# Patient Record
Sex: Female | Born: 1945 | Race: White | Hispanic: No | Marital: Married | Smoking: Former smoker
Health system: Southern US, Community
[De-identification: ages and names within clinical notes are randomized; demographics above are authoritative.]

## PROBLEM LIST (undated history)

## (undated) DIAGNOSIS — R911 Solitary pulmonary nodule: Secondary | ICD-10-CM

## (undated) DIAGNOSIS — R7611 Nonspecific reaction to tuberculin skin test without active tuberculosis: Secondary | ICD-10-CM

## (undated) DIAGNOSIS — Z0000 Encounter for general adult medical examination without abnormal findings: Secondary | ICD-10-CM

## (undated) DIAGNOSIS — M13879 Other specified arthritis, unspecified ankle and foot: Secondary | ICD-10-CM

## (undated) DIAGNOSIS — M797 Fibromyalgia: Secondary | ICD-10-CM

## (undated) DIAGNOSIS — Z780 Asymptomatic menopausal state: Secondary | ICD-10-CM

## (undated) DIAGNOSIS — R9389 Abnormal findings on diagnostic imaging of other specified body structures: Secondary | ICD-10-CM

## (undated) DIAGNOSIS — G5600 Carpal tunnel syndrome, unspecified upper limb: Secondary | ICD-10-CM

## (undated) HISTORY — DX: Solitary pulmonary nodule: R91.1

## (undated) HISTORY — DX: Other specified arthritis, unspecified ankle and foot: M13.879

## (undated) HISTORY — DX: Carpal tunnel syndrome, unspecified upper limb: G56.00

## (undated) HISTORY — DX: Nonspecific reaction to tuberculin skin test without active tuberculosis: R76.11

## (undated) HISTORY — DX: Encounter for general adult medical examination without abnormal findings: Z00.00

## (undated) HISTORY — DX: Fibromyalgia: M79.7

## (undated) HISTORY — PX: CATARACT EXTRACTION: SUR2

## (undated) HISTORY — PX: OTHER SURGICAL HISTORY: SHX169

## (undated) HISTORY — DX: Abnormal findings on diagnostic imaging of other specified body structures: R93.89

## (undated) HISTORY — DX: Asymptomatic menopausal state: Z78.0

---

## 1997-10-20 ENCOUNTER — Ambulatory Visit (HOSPITAL_COMMUNITY): Admission: RE | Admit: 1997-10-20 | Discharge: 1997-10-20 | Payer: Self-pay | Admitting: *Deleted

## 1998-12-13 ENCOUNTER — Ambulatory Visit (HOSPITAL_COMMUNITY): Admission: RE | Admit: 1998-12-13 | Discharge: 1998-12-13 | Payer: Self-pay | Admitting: *Deleted

## 2000-01-02 ENCOUNTER — Encounter: Payer: Self-pay | Admitting: Obstetrics and Gynecology

## 2000-01-02 ENCOUNTER — Ambulatory Visit (HOSPITAL_COMMUNITY): Admission: RE | Admit: 2000-01-02 | Discharge: 2000-01-02 | Payer: Self-pay | Admitting: Obstetrics and Gynecology

## 2001-01-21 ENCOUNTER — Other Ambulatory Visit: Admission: RE | Admit: 2001-01-21 | Discharge: 2001-01-21 | Payer: Self-pay | Admitting: Internal Medicine

## 2001-01-31 ENCOUNTER — Encounter: Payer: Self-pay | Admitting: Obstetrics and Gynecology

## 2001-01-31 ENCOUNTER — Ambulatory Visit (HOSPITAL_COMMUNITY): Admission: RE | Admit: 2001-01-31 | Discharge: 2001-01-31 | Payer: Self-pay | Admitting: Obstetrics and Gynecology

## 2002-02-26 ENCOUNTER — Other Ambulatory Visit: Admission: RE | Admit: 2002-02-26 | Discharge: 2002-02-26 | Payer: Self-pay | Admitting: Internal Medicine

## 2002-04-02 ENCOUNTER — Encounter: Payer: Self-pay | Admitting: Internal Medicine

## 2002-04-02 ENCOUNTER — Ambulatory Visit (HOSPITAL_COMMUNITY): Admission: RE | Admit: 2002-04-02 | Discharge: 2002-04-02 | Payer: Self-pay | Admitting: Internal Medicine

## 2002-04-09 ENCOUNTER — Encounter: Payer: Self-pay | Admitting: Internal Medicine

## 2002-04-09 ENCOUNTER — Ambulatory Visit (HOSPITAL_COMMUNITY): Admission: RE | Admit: 2002-04-09 | Discharge: 2002-04-09 | Payer: Self-pay | Admitting: Internal Medicine

## 2003-09-15 ENCOUNTER — Ambulatory Visit (HOSPITAL_COMMUNITY): Admission: RE | Admit: 2003-09-15 | Discharge: 2003-09-15 | Payer: Self-pay | Admitting: Internal Medicine

## 2003-11-17 ENCOUNTER — Other Ambulatory Visit: Admission: RE | Admit: 2003-11-17 | Discharge: 2003-11-17 | Payer: Self-pay | Admitting: Internal Medicine

## 2003-12-02 ENCOUNTER — Ambulatory Visit (HOSPITAL_COMMUNITY): Admission: RE | Admit: 2003-12-02 | Discharge: 2003-12-02 | Payer: Self-pay | Admitting: Internal Medicine

## 2003-12-02 ENCOUNTER — Encounter: Payer: Self-pay | Admitting: Family Medicine

## 2006-08-06 ENCOUNTER — Ambulatory Visit: Payer: Self-pay | Admitting: Family Medicine

## 2006-08-21 ENCOUNTER — Ambulatory Visit: Payer: Self-pay

## 2006-08-21 ENCOUNTER — Encounter: Payer: Self-pay | Admitting: Cardiology

## 2006-08-22 ENCOUNTER — Ambulatory Visit: Payer: Self-pay | Admitting: Family Medicine

## 2006-08-22 LAB — CONVERTED CEMR LAB
GFR calc Af Amer: 82 mL/min
Glucose, Bld: 92 mg/dL (ref 70–99)
Sodium: 143 meq/L (ref 135–145)

## 2006-11-18 DIAGNOSIS — IMO0001 Reserved for inherently not codable concepts without codable children: Secondary | ICD-10-CM | POA: Insufficient documentation

## 2006-11-18 DIAGNOSIS — G5600 Carpal tunnel syndrome, unspecified upper limb: Secondary | ICD-10-CM | POA: Insufficient documentation

## 2006-11-26 ENCOUNTER — Ambulatory Visit: Payer: Self-pay | Admitting: Family Medicine

## 2006-11-26 DIAGNOSIS — I10 Essential (primary) hypertension: Secondary | ICD-10-CM | POA: Insufficient documentation

## 2006-11-28 ENCOUNTER — Encounter: Payer: Self-pay | Admitting: Family Medicine

## 2007-02-13 ENCOUNTER — Encounter: Payer: Self-pay | Admitting: Family Medicine

## 2007-02-13 ENCOUNTER — Other Ambulatory Visit: Admission: RE | Admit: 2007-02-13 | Discharge: 2007-02-13 | Payer: Self-pay | Admitting: Family Medicine

## 2007-02-13 ENCOUNTER — Ambulatory Visit: Payer: Self-pay | Admitting: Family Medicine

## 2007-02-13 DIAGNOSIS — N951 Menopausal and female climacteric states: Secondary | ICD-10-CM | POA: Insufficient documentation

## 2007-02-13 DIAGNOSIS — M13879 Other specified arthritis, unspecified ankle and foot: Secondary | ICD-10-CM | POA: Insufficient documentation

## 2007-02-13 LAB — CONVERTED CEMR LAB
Blood in Urine, dipstick: NEGATIVE
Ketones, urine, test strip: NEGATIVE
Nitrite: NEGATIVE
Pap Smear: NORMAL
Protein, U semiquant: NEGATIVE
Specific Gravity, Urine: <1.005
Urobilinogen, UA: NEGATIVE
WBC Urine, dipstick: NEGATIVE

## 2007-02-19 ENCOUNTER — Ambulatory Visit: Payer: Self-pay | Admitting: Internal Medicine

## 2007-02-19 ENCOUNTER — Encounter (INDEPENDENT_AMBULATORY_CARE_PROVIDER_SITE_OTHER): Payer: Self-pay | Admitting: *Deleted

## 2007-02-20 ENCOUNTER — Encounter: Admission: RE | Admit: 2007-02-20 | Discharge: 2007-02-20 | Payer: Self-pay | Admitting: Family Medicine

## 2007-02-20 ENCOUNTER — Telehealth: Payer: Self-pay | Admitting: Family Medicine

## 2007-02-20 DIAGNOSIS — R930 Abnormal findings on diagnostic imaging of skull and head, not elsewhere classified: Secondary | ICD-10-CM | POA: Insufficient documentation

## 2007-02-21 ENCOUNTER — Encounter: Admission: RE | Admit: 2007-02-21 | Discharge: 2007-02-21 | Payer: Self-pay | Admitting: Family Medicine

## 2007-02-24 DIAGNOSIS — J984 Other disorders of lung: Secondary | ICD-10-CM | POA: Insufficient documentation

## 2007-02-24 LAB — CONVERTED CEMR LAB
BUN: 11 mg/dL (ref 6–23)
Basophils Absolute: 0 10*3/uL (ref 0.0–0.1)
Bilirubin, Direct: 0.1 mg/dL (ref 0.0–0.3)
CO2: 32 meq/L (ref 19–32)
Chloride: 103 meq/L (ref 96–112)
Cholesterol: 264 mg/dL (ref 0–200)
Creatinine, Ser: 1 mg/dL (ref 0.4–1.2)
Direct LDL: 181.3 mg/dL
Eosinophils Absolute: 0.1 10*3/uL (ref 0.0–0.6)
GFR calc Af Amer: 73 mL/min
GFR calc non Af Amer: 60 mL/min
Glucose, Bld: 97 mg/dL (ref 70–99)
HDL: 57.9 mg/dL (ref >39.0)
Hemoglobin: 14.5 g/dL (ref 12.0–15.0)
Lymphocytes Relative: 24 % (ref 12.0–46.0)
Platelets: 224 10*3/uL (ref 150–400)
Potassium: 4.6 meq/L (ref 3.5–5.1)
RBC: 4.52 M/uL (ref 3.87–5.11)
Sodium: 141 meq/L (ref 135–145)
TSH: 3.93 microintl units/mL (ref 0.35–5.50)
Total CHOL/HDL Ratio: 4.6

## 2007-02-26 ENCOUNTER — Ambulatory Visit: Payer: Self-pay | Admitting: Family Medicine

## 2007-02-26 ENCOUNTER — Encounter: Admission: RE | Admit: 2007-02-26 | Discharge: 2007-02-26 | Payer: Self-pay | Admitting: Family Medicine

## 2007-02-27 ENCOUNTER — Encounter (INDEPENDENT_AMBULATORY_CARE_PROVIDER_SITE_OTHER): Payer: Self-pay | Admitting: *Deleted

## 2007-02-28 ENCOUNTER — Ambulatory Visit: Payer: Self-pay | Admitting: Pulmonary Disease

## 2007-03-03 ENCOUNTER — Encounter (INDEPENDENT_AMBULATORY_CARE_PROVIDER_SITE_OTHER): Payer: Self-pay | Admitting: *Deleted

## 2007-03-10 ENCOUNTER — Ambulatory Visit (HOSPITAL_COMMUNITY): Admission: RE | Admit: 2007-03-10 | Discharge: 2007-03-10 | Payer: Self-pay | Admitting: Pulmonary Disease

## 2007-03-17 ENCOUNTER — Encounter (INDEPENDENT_AMBULATORY_CARE_PROVIDER_SITE_OTHER): Payer: Self-pay | Admitting: Interventional Radiology

## 2007-03-17 ENCOUNTER — Ambulatory Visit (HOSPITAL_COMMUNITY): Admission: RE | Admit: 2007-03-17 | Discharge: 2007-03-17 | Payer: Self-pay | Admitting: Pulmonary Disease

## 2007-03-19 ENCOUNTER — Ambulatory Visit: Payer: Self-pay | Admitting: Pulmonary Disease

## 2007-03-19 LAB — CONVERTED CEMR LAB
Angiotensin 1 Converting Enzyme: 44 units/L (ref 9–67)
Anti Nuclear Antibody(ANA): NEGATIVE
Rhuematoid fact SerPl-aCnc: <20 intl units/mL — ABNORMAL LOW (ref 0.0–20.0)
Sed Rate: 18 mm/hr (ref 0–25)

## 2007-04-08 ENCOUNTER — Telehealth: Payer: Self-pay | Admitting: Internal Medicine

## 2007-05-06 ENCOUNTER — Telehealth (INDEPENDENT_AMBULATORY_CARE_PROVIDER_SITE_OTHER): Payer: Self-pay | Admitting: *Deleted

## 2007-05-09 ENCOUNTER — Encounter: Payer: Self-pay | Admitting: Family Medicine

## 2007-07-01 ENCOUNTER — Ambulatory Visit: Payer: Self-pay | Admitting: Cardiology

## 2007-07-01 ENCOUNTER — Encounter: Payer: Self-pay | Admitting: Pulmonary Disease

## 2007-07-08 ENCOUNTER — Telehealth: Payer: Self-pay | Admitting: Pulmonary Disease

## 2007-08-04 ENCOUNTER — Ambulatory Visit: Payer: Self-pay | Admitting: Pulmonary Disease

## 2008-01-29 ENCOUNTER — Ambulatory Visit: Payer: Self-pay | Admitting: Internal Medicine

## 2008-01-30 ENCOUNTER — Telehealth (INDEPENDENT_AMBULATORY_CARE_PROVIDER_SITE_OTHER): Payer: Self-pay | Admitting: *Deleted

## 2008-02-03 ENCOUNTER — Ambulatory Visit: Payer: Self-pay | Admitting: Pulmonary Disease

## 2008-02-04 ENCOUNTER — Telehealth (INDEPENDENT_AMBULATORY_CARE_PROVIDER_SITE_OTHER): Payer: Self-pay | Admitting: *Deleted

## 2008-09-10 ENCOUNTER — Ambulatory Visit (HOSPITAL_COMMUNITY): Admission: RE | Admit: 2008-09-10 | Discharge: 2008-09-10 | Payer: Self-pay | Admitting: Unknown Physician Specialty

## 2008-09-28 ENCOUNTER — Ambulatory Visit: Payer: Self-pay | Admitting: Cardiology

## 2008-09-29 ENCOUNTER — Ambulatory Visit: Payer: Self-pay | Admitting: Pulmonary Disease

## 2008-09-29 ENCOUNTER — Telehealth (INDEPENDENT_AMBULATORY_CARE_PROVIDER_SITE_OTHER): Payer: Self-pay | Admitting: *Deleted

## 2009-05-19 ENCOUNTER — Ambulatory Visit: Payer: Self-pay | Admitting: Cardiovascular Disease

## 2009-05-23 ENCOUNTER — Ambulatory Visit: Payer: Self-pay | Admitting: Pulmonary Disease

## 2009-08-12 IMAGING — MG MM DIGITAL SCREENING
4 series · 4 of 4 positions shown · non-contrast
Comparison: none

DG SCREEN MAMMOGRAM BILATERAL
Bilateral CC and MLO view(s) were taken.
Technologist: Phumlisa Wax

DIGITAL SCREENING MAMMOGRAM WITH CAD:
The breast tissue is heterogeneously dense.  No masses or malignant type calcifications are 
identified.  Compared with prior studies.

[R CC]
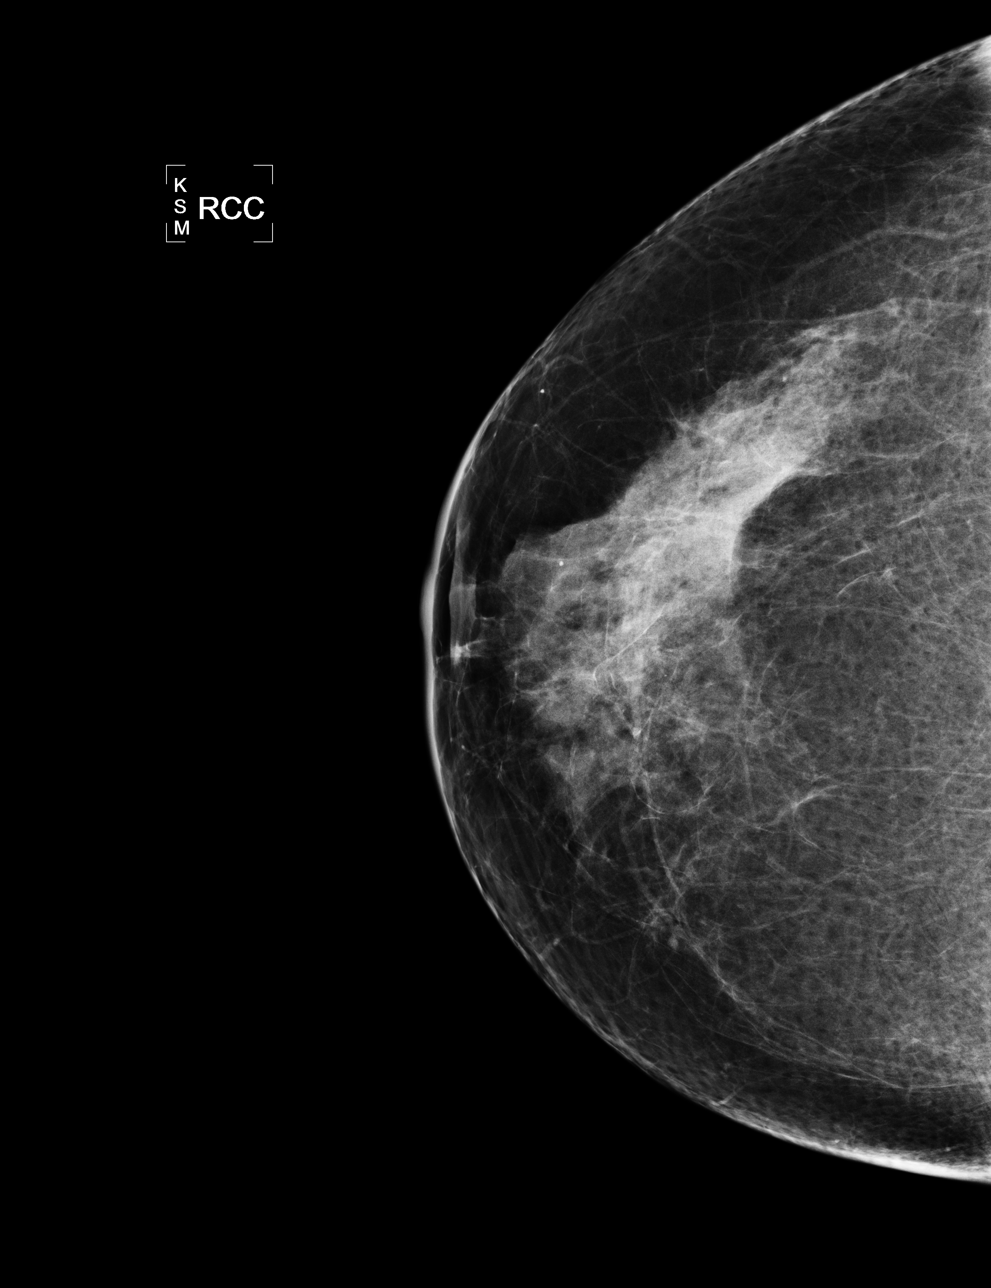

[L CC]
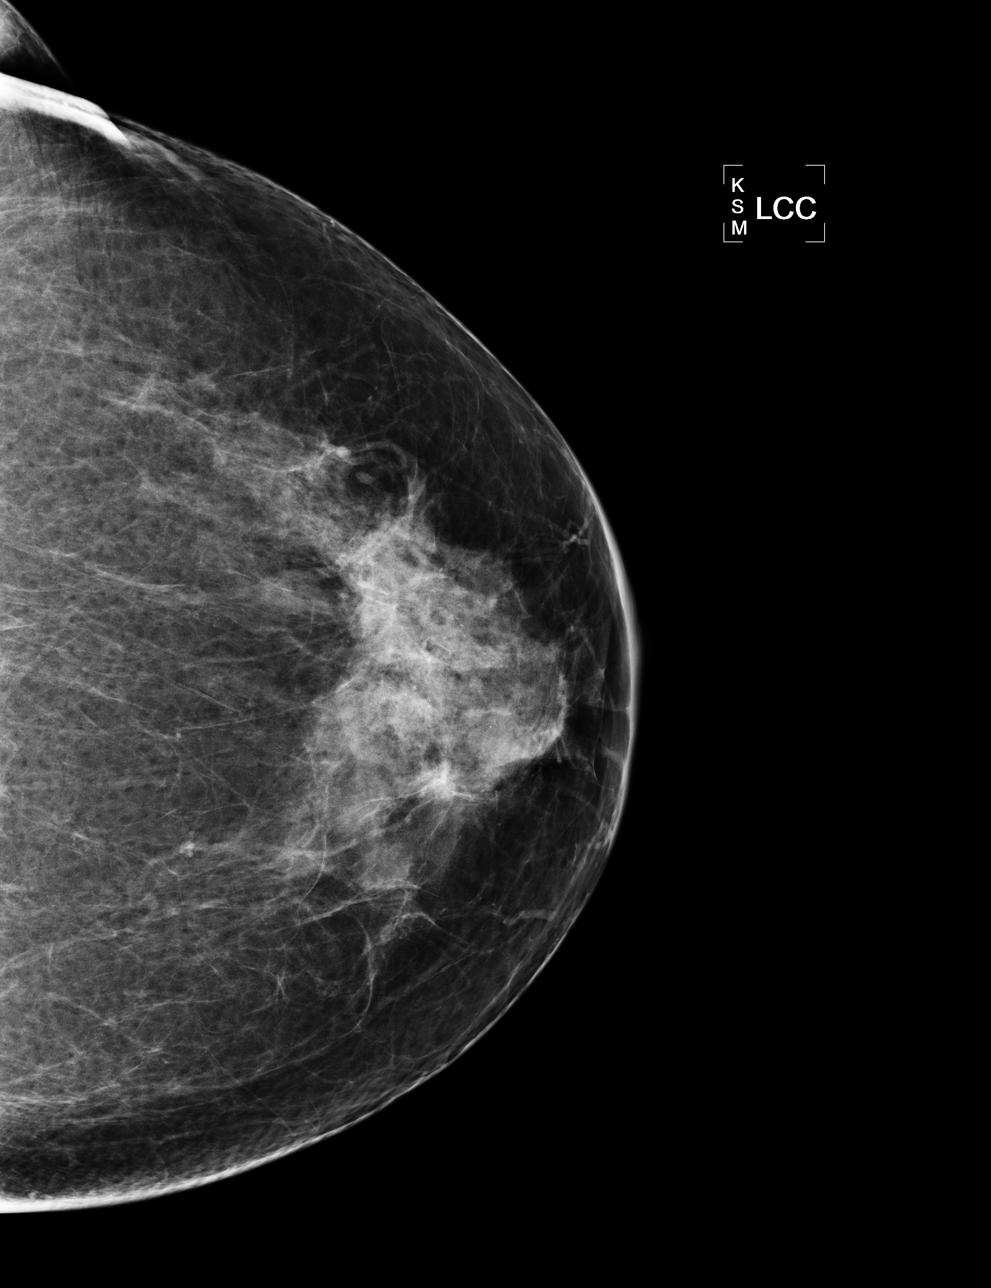

[L MLO]
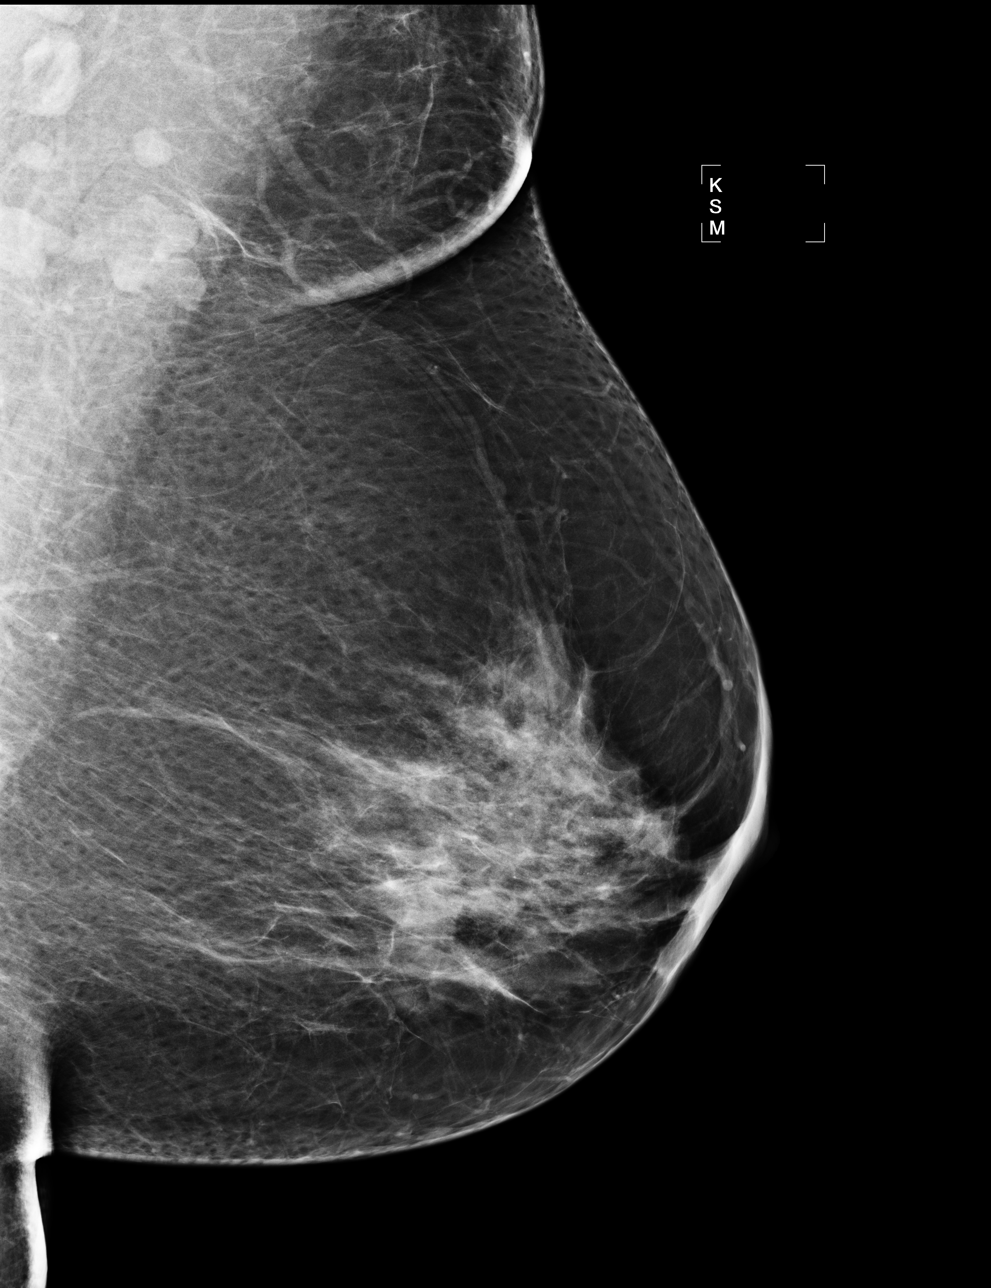

[R MLO]
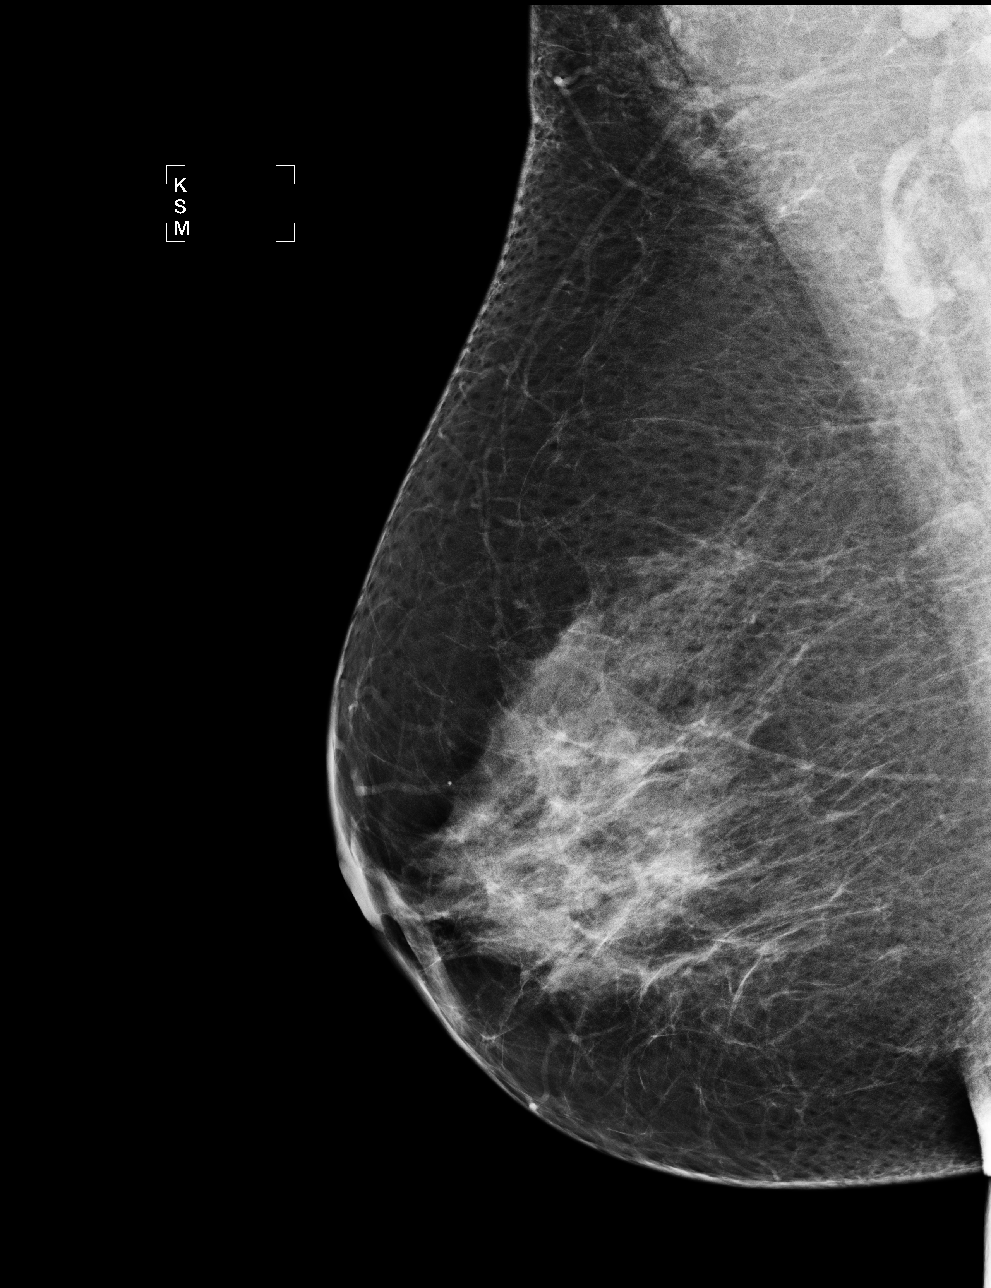

[4 of 4 positions shown; findings below may reference images not displayed]

IMPRESSION: No specific mammographic evidence of malignancy.  Next screening mammogram is recommended in one 
year.

ASSESSMENT: Negative - BI-RADS 1

Screening mammogram in 1 year.
THIS WAS ANALAYZED BY COMPUTER AIDED DETECTION. , THIS PROCEDURE WAS A DIGITAL MAMMOGRAM.

## 2009-11-16 ENCOUNTER — Ambulatory Visit: Payer: Self-pay | Admitting: Cardiology

## 2009-11-16 ENCOUNTER — Ambulatory Visit: Payer: Self-pay

## 2009-11-16 ENCOUNTER — Encounter: Payer: Self-pay | Admitting: Cardiology

## 2009-11-16 ENCOUNTER — Ambulatory Visit (HOSPITAL_COMMUNITY): Admission: RE | Admit: 2009-11-16 | Discharge: 2009-11-16 | Payer: Self-pay | Admitting: Internal Medicine

## 2009-11-17 ENCOUNTER — Encounter: Payer: Self-pay | Admitting: Cardiovascular Disease

## 2010-01-20 ENCOUNTER — Ambulatory Visit (HOSPITAL_COMMUNITY): Admission: RE | Admit: 2010-01-20 | Discharge: 2010-01-20 | Payer: Self-pay | Admitting: Unknown Physician Specialty

## 2010-02-01 ENCOUNTER — Encounter: Admission: RE | Admit: 2010-02-01 | Discharge: 2010-02-01 | Payer: Self-pay | Admitting: Unknown Physician Specialty

## 2010-04-19 ENCOUNTER — Telehealth (INDEPENDENT_AMBULATORY_CARE_PROVIDER_SITE_OTHER): Payer: Self-pay | Admitting: *Deleted

## 2010-05-17 ENCOUNTER — Encounter: Payer: Self-pay | Admitting: Pulmonary Disease

## 2010-05-18 ENCOUNTER — Ambulatory Visit: Payer: Self-pay | Admitting: Pulmonary Disease

## 2010-05-26 ENCOUNTER — Ambulatory Visit: Payer: Self-pay | Admitting: Pulmonary Disease

## 2010-06-02 ENCOUNTER — Ambulatory Visit: Payer: Self-pay | Admitting: Pulmonary Disease

## 2010-06-13 ENCOUNTER — Ambulatory Visit: Payer: Self-pay | Admitting: Pulmonary Disease

## 2010-06-13 ENCOUNTER — Encounter: Payer: Self-pay | Admitting: Pulmonary Disease

## 2010-06-16 ENCOUNTER — Telehealth: Payer: Self-pay | Admitting: Pulmonary Disease

## 2010-07-10 ENCOUNTER — Encounter: Payer: Self-pay | Admitting: Family Medicine

## 2010-07-20 NOTE — Progress Notes (Signed)
Summary: cxr/ prior auth? - nees to call insurance co to discuss  Phone Note Call from Patient Call back at [facility] Phone [phone]   Caller: Patient Call For: Amber Compton Summary of Call: pt has scheduled a 1 yr f/u w/ dr Vassie Loll for 05/18/10. she needs cxr at same time of visit. it may need prior auth.  Initial call taken by: Tivis Ringer, CNA,  April 19, 2010 9:40 AM  Follow-up for Phone Call        per last ov w/ RA, pt to follow up yearly with cxr.  will have to wait for ov to determine if cxr needs PA.  LMOM TCBx1. Boone Master CNA/MA  April 19, 2010 10:20 AM   pt returned call.  tried to explain that we will not know if her cxr needs PA before her appt, but that it should not be an issue.  pt stated that her concern with that answer is that she will not know if the cxr will not be covered before the cxr is done.  i then advised that pt that she needs to call her insurance company to discuss this with them.  pt verbalized her understanding. Follow-up by: Boone Master CNA/MA,  April 19, 2010 10:33 AM

## 2010-07-20 NOTE — Miscellaneous (Signed)
Summary: Orders Update pft charges  Clinical Lists Changes  Orders: Added new Service order of Carbon Monoxide diffusing w/capacity (94720) - Signed Added new Service order of Lung Volumes (94240) - Signed Added new Service order of Spirometry (Pre & Post) (94060) - Signed 

## 2010-07-20 NOTE — Progress Notes (Signed)
Summary: PFT results  Phone Note Call from Patient Call back at Home Phone [phone]   Caller: Patient Call For: alva Summary of Call: wants pft results Initial call taken by: Tivis Ringer, CNA,  June 16, 2010 1:57 PM  Follow-up for Phone Call        Pt stated she had PFT done on Tuesday and is requesting results on same in order to know if she need to proceed with GI referral per PMD. I advised pt RA is out of the office and is not scheduled for return until Wednesday. PFT is in folder in RA's "look at". Please advise. Thanks. Zackery Barefoot CMA  June 16, 2010 2:35 PM   Additional Follow-up for Phone Call Additional follow up Details #1::        Normal  - no obstruction or restriction routine FU  Additional Follow-up by: Comer Locket. Vassie Loll MD,  June 21, 2010 5:05 PM    Additional Follow-up for Phone Call Additional follow up Details #2::    Patient is aware of PFT results per RA and would like a copy of those results mailed to her home address which was verified with the patient. Will forward to RA nurse. Michel Bickers [facility]  June 21, 2010 5:17 PM  Address verified with patient and copy of PFT mailed to pt. Zackery Barefoot CMA  June 23, 2010 10:23 AM

## 2010-07-20 NOTE — Assessment & Plan Note (Signed)
Summary: f/u w/ cxr ///kp   Visit Type:  Follow-up Primary Provider/Referring Provider:  VELASQUEZ ([place])  CC:  1 year follow up.  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 These were not seen on earlier imaging studies incl CXR in 6/05 & CT in10/03. She underwent a chest x-ray in 9/08 due to positive PPD.  This suggested nodular opacities.   CT of the chest with contrast showed scattered bilateral ill-defined lung opacities.  A 2.2 x 2.2 cm lesion was in the anterior right upper lobe, a 2.1 x 2.5 cm opacity in the lingula, and 2.1 x 3 cm opacity in the left lung base.  Other scattered nodules were also seen. Of note, these were not present in 2005 imaging studies. ACE nml, ANA -ve, ESR 18 Chest x-ray from June, 2005 suggested left paratracheal adenopathy and granulomatous changes in the right paratracheal region.  This seemed to be stable when compared to prior x-ray in June, 2005.  A CT in October,2003 showed noncalcified lymph node lateral to the aortic arch, measuring 1.8 x 1.5 cm and calcified granuloma in the right upper lobe.   Chest x-ray from 1980 showed 1 cm calcified lesion in the superior right mid lung field.   2/09 Minimal decrease in size of nodules, LUL may be minimally increased, PET +, Bx s/o granulomas, afb cx -ve.  8/09 f/u CT >>unchanged nodules 12/10 CT > stable lesions  May 18, 2010 1:41 PM  c/o head congestion, echo was nml (for syncope) No fevers, night sweats, wt loss, loss of appetitie, skin rash, joint pains.   Preventive Screening-Counseling & Management  Alcohol-Tobacco     Alcohol drinks/day: <1     Alcohol type: wine     Smoking Status: quit     Packs/Day: 0.5     Year Started: 1966     Year Quit: 1968     Pack years: 1.5  Current Medications (verified): 1)  Hydrochlorothiazide 25 Mg Tabs (Hydrochlorothiazide) .... Take 1 Tablet By Mouth Once A Day 2)  Fish Oil 1200  Mg Caps (Omega-3 Fatty Acids) .... Take 1 Capsule By Mouth Once A Day 3)  Caltrate 600+d 600-400 Mg-Unit Tabs (Calcium Carbonate-Vitamin D) .... Take 1 Tablet By Mouth Once A Day 4)  Simvastatin 20 Mg  Tabs (Simvastatin) .... Take 1 Tablet By Mouth Once A Day 5)  Coenzyme Q10 100 Mg Caps (Coenzyme Q10) .... Take 1 Tablet By Mouth Once A Day 6)  Multivitamins   Tabs (Multiple Vitamin) .... Take 1 Tablet By Mouth Once A Day 7)  Klor-Con M20 20 Meq Cr-Tabs (Potassium Chloride Crys Cr) .... Take 1 Tablet By Mouth Once A Day 8)  Advil 200 Mg Tabs (Ibuprofen) .... As Needed 9)  Mucinex 600 Mg Xr12h-Tab (Guaifenesin) .... Take 1 Tablet By Mouth Two Times A Day  Allergies (verified): 1)  ! Neosporin  Past History:  Past Medical History: Last updated: 05/09/2007 PULMONARY NODULE (ICD-518.89) ABNORMAL CHEST XRAY (ICD-793.1) POSTMENOPAUSAL STATUS (ICD-627.2) ARTHRITIS, CLIMACTERIC, ANKLE/FOOT (ICD-716.37) POSITIVE PPD (ICD-795.5) PREVENTIVE HEALTH CARE (ICD-V70.0) ESSENTIAL HYPERTENSION (ICD-401.9) FAMILY HISTORY DEPRESSION (ICD-V17.0) FAMILY HISTORY OF ALCOHOLISM/ADDICTION (ICD-V61.41) CARPAL TUNNEL SYNDROME, LEFT (ICD-354.0) FIBROMYALGIA (ICD-729.1)  Social History: Last updated: 02/13/2007 Married Former Smoker Alcohol use-yes-occ Drug use-no Regular exercise-yes  Social History: Packs/Day:  0.5  Review of Systems  The patient denies anorexia, fever, weight loss, weight gain, vision loss, decreased hearing, hoarseness, chest pain, syncope, dyspnea on exertion, peripheral  edema, prolonged cough, headaches, hemoptysis, abdominal pain, melena, hematochezia, severe indigestion/heartburn, hematuria, muscle weakness, suspicious skin lesions, transient blindness, difficulty walking, depression, unusual weight change, abnormal bleeding, enlarged lymph nodes, and angioedema.    Vital Signs:  Patient profile:   65 year old female Height:      65.5 inches Weight:      177.6 pounds BMI:      29.21 O2 Sat:      97 % on Room air Temp:     98.0 degrees F oral Pulse rate:   83 / minute BP sitting:   116 / 70  (left arm) Cuff size:   regular  Vitals Entered By: Zackery Barefoot CMA (May 18, 2010 1:34 PM)  O2 Flow:  Room air CC: 1 year follow up Comments Medications reviewed with patient Verified contact number and pharmacy with patient Zackery Barefoot CMA  May 18, 2010 1:34 PM    Physical Exam  Additional Exam:  Gen. Pleasant, well-nourished, in no distress ENT - no lesions, no post nasal drip Neck: No JVD, no thyromegaly, no carotid bruits Lungs: no use of accessory muscles, no dullness to percussion, clear without rales or rhonchi  Cardiovascular: Rhythm regular, heart sounds  normal, no murmurs or gallops, no peripheral edema Musculoskeletal: No deformities, no cyanosis or clubbing      CXR  Procedure date:  05/18/2010  Findings:      IMPRESSION: On the previous CT bilateral pulmonary lesions were seen with multiple bilateral pulmonary nodular mass like opacities.  On the current chest radiographic examination there is some linear infiltrative density with nodularity seen in the lower left midlung zone.  There is nodular infiltrative density in the left base with adjacent costophrenic angle blunting consistent with pleural thickening unchanged from previous CT.  The other areas of nodularity cannot be definitely discretely visualized.  No new lesion or enlarging lesion is evident.  Hyperinflation configuration.  Impression & Recommendations:  Problem # 1:  PULMONARY NODULE (ICD-518.89) Appears stable by CXR  DD includes post inflammatory, sarcoid or less likely sero negative RA , note  ANA neg - doubt other collagen vascular disease. Obtain PFTs , if decreased lung capacity would consider reimaging with CT , otherwise FU with CXR should suffice. Orders: Pulmonary Referral (Pulmonary) Est. Patient Level IV (16109)  Medications Added to  Medication List This Visit: 1)  Fish Oil 1200 Mg Caps (Omega-3 fatty acids) .... Take 1 capsule by mouth once a day 2)  Klor-con M20 20 Meq Cr-tabs (Potassium chloride crys cr) .... Take 1 tablet by mouth once a day 3)  Advil 200 Mg Tabs (Ibuprofen) .... As needed 4)  Mucinex 600 Mg Xr12h-tab (Guaifenesin) .... Take 1 tablet by mouth two times a day  Other Orders: Flu Vaccine 38yrs + (60454) Admin 1st Vaccine (09811)  Patient Instructions: 1)  Copy sent to:dr. velasquez 2)  Please schedule a follow-up appointment in 6 months. 3)  You have been asked to make an appointment for a Pulmonary Function Test (Breathing Test) prior to or at the time of your next visit. Use medications as usual unless otherwise instructed.    Immunizations Administered:  Influenza Vaccine # 1:    Vaccine Type: Fluvax 3+    Site: right deltoid    Mfr: GlaxoSmithKline    Dose: 0.5 ml    Route: IM    Given by: Zackery Barefoot CMA    Exp. Date: 12/16/2010    Lot #: [id number]    VIS  given: 01/10/10 version given May 18, 2010.

## 2010-07-20 NOTE — Miscellaneous (Signed)
Summary: Orders Update  Clinical Lists Changes  Orders: Added new Test order of T-2 View CXR (71020TC) - Signed 

## 2010-10-31 NOTE — Letter (Signed)
February 28, 2007    Lelon Perla, DO  [address] [place], [place] [address]   RE:  Amber Compton, Amber Compton  MRN:  [id number]  /  DOB:  28-Dec-1945   Dear Dr. Laury Axon,   Thank you for this reference.  You are aware that Amber Compton is a  pleasant 65 year old Caucasian woman who underwent a chest x-ray due to  positive PPD.  This suggested nodular opacities.  A subsequent CT of the  chest with contrast showed scattered bilateral ill-defined lung  opacities.  A 2.2 x 2.2 cm lesion was in the anterior right upper lobe,  a 2.1 x 2.5 cm opacity in the lingula, and 2.1 x 3 cm opacity in the  left lung base.  Other scattered nodules were also seen.  She is  referred to [place].  She denies cough, sputum production, wheezing, dyspnea,  hemoptysis.  She denies fevers or weight loss.  She denies change in her  bowel habits and has not noted any breast lumps.  She had a mammogram  done two days ago.   PAST MEDICAL HISTORY:  1. Hypertension.  2. Hyperlipidemia.  3. Basal cell scan of the skin on her back.  4. Positive PPD for many years.  5. Carpal tunnel syndrome.   PAST SURGICAL HISTORY:  None.   ALLERGIES:  None.   CURRENT MEDICATIONS:  1. HCTZ 25 mg daily.  2. Prevacid 40 mg daily.  3. Multivitamins.  4. Fish oil.  5. Calcium.   SOCIAL HISTORY:  She smoked about a half pack per day for three years  and quit in 1972.  She is married and lives with her husband.   FAMILY HISTORY:  Heart disease in her mother.  Father died of lung  cancer.   REVIEW OF SYSTEMS:  Reports occasional heartburn and indigestion.  She  had swelling in her hands which resolved with prednisone tablets.   Chest x-ray from June, 2005 suggests left paratracheal adenopathy and  granulomatous changes in the right paratracheal region.  This seems to  be stable when compared to prior x-ray in June, 2005.  A CT in October,  2003 showed noncalcified lymph node lateral to the aortic arch,  measuring 1.8 x 1.5 cm and  calcified granuloma in the right upper lobe.   Chest x-ray from 1980 showed 1 cm calcified lesion in the superior right  mid lung field.   IMPRESSION:  Multiple pulmonary nodules, as described above, are very  suspicious for metastatic malignancy.  She certainly does not seem to  have any symptoms that suggest a pneumonia.  It is conceivable that  these lesions could represent a cryptogenic pneumonia (bronchiolitis-  obliterans organizing pneumonia), but they seem to be more solid than  consolidated lung.  I would like to know the results of her mammogram.  I spent more than 30 mins & discussed various options with the patient  and her husband, including the wait and watch approach, a PET scan, and  a biopsy.  Clearly, I would recommend a biopsy here, as malignancy is  the most likely diagnosis.   I would recommend a CT-guided needle biopsy, as at least two of these  nodules seem very peripheral and subpleural.  I explained that the risk  of pneumothorax and lung collapse would be extremely small given the  peripheral nature of the lesion.  I discussed the procedure and  complications of the procedure.   Amber Compton wanted to discuss this some more  with her husband.  She is  going to be out of town for a week.  There is considerable anxiety  regarding this diagnosis.  I gave her Xanax 0.25 mg b.i.d. for two  weeks.  I have meanwhile proceeded with a PET scan.  She will call us  back to indicate her readiness for the biopsy.  We will keep you  informed as to her progress.    Sincerely,      Oretha Milch, MD  Electronically Signed    RVA/MedQ  DD: 02/28/2007  DT: 03/01/2007  Job #: [id number]

## 2011-01-03 ENCOUNTER — Encounter: Payer: Self-pay | Admitting: Pulmonary Disease

## 2011-01-04 ENCOUNTER — Ambulatory Visit (INDEPENDENT_AMBULATORY_CARE_PROVIDER_SITE_OTHER): Payer: 59 | Admitting: Pulmonary Disease

## 2011-01-04 ENCOUNTER — Encounter: Payer: Self-pay | Admitting: Pulmonary Disease

## 2011-01-04 ENCOUNTER — Ambulatory Visit (INDEPENDENT_AMBULATORY_CARE_PROVIDER_SITE_OTHER)
Admission: RE | Admit: 2011-01-04 | Discharge: 2011-01-04 | Disposition: A | Discharge status: Home / Self Care | Payer: 59 | Source: Ambulatory Visit | Attending: Pulmonary Disease | Admitting: Pulmonary Disease

## 2011-01-04 VITALS — BP 128/82 | HR 73 | Temp 98.4°F | Ht 65.0 in | Wt 170.4 lb

## 2011-01-04 DIAGNOSIS — R911 Solitary pulmonary nodule: Secondary | ICD-10-CM

## 2011-01-04 DIAGNOSIS — J984 Other disorders of lung: Secondary | ICD-10-CM

## 2011-01-04 NOTE — Progress Notes (Signed)
  Subjective:    Patient ID: Amber Compton, female    DOB: 02/20/1946, 65 y.o.   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  These were not seen on earlier imaging studies incl CXR in 6/05 & CT in10/03.  She underwent a chest x-ray in 9/08 due to positive PPD. This suggested nodular opacities. CT of the chest with contrast showed scattered bilateral ill-defined lung opacities. A 2.2 x 2.2 cm lesion was in the anterior right upper lobe, a 2.1 x 2.5 cm opacity in the lingula, and 2.1 x 3 cm opacity in the left lung base. Other scattered nodules were also seen. Of note, these were not present in 2005 imaging studies.  ACE nml, ANA -ve, ESR 18  Chest x-ray from June, 2005 suggested left paratracheal adenopathy and granulomatous changes in the right paratracheal region. This seemed to be stable when compared to prior x-ray in June, 2005. A CT in October,2003 showed noncalcified lymph node lateral to the aortic arch, measuring 1.8 x 1.5 cm and calcified granuloma in the right upper lobe.  Chest x-ray from 1980 showed 1 cm calcified lesion in the superior right mid lung field.  2/09  PET +, Bx s/o granulomas, afb cx -ve.  Unchanged nodules on 2 yr FU CT , last dec'10 PFTs dec'11 nml lung function  May 18, 2010  c/o head congestion, echo was nml (for syncope)   January 04 2011 Developed substernal discomfort, treated for GERD with omeprazole, ENT evaln neg. CXR showed chronic changes at the lung bases with an area of increased nodularity in the left mid lung at site of previously seen linear  density; developing mass not excluded.  No fevers, night sweats, wt loss, loss of appetitie, skin rash, joint pains.   Review of Systems Pt denies any significant  nasal congestion or excess secretions, fever, chills, sweats, unintended wt loss, pleuritic or exertional cp, orthopnea pnd or leg swelling.  Pt also denies any  obvious fluctuation in symptoms with weather or environmental change or other alleviating or aggravating factors.    Pt denies any increase in rescue therapy over baseline, denies waking up needing it or having early am exacerbations or coughing/wheezing/ or dyspnea      Objective:   Physical Exam Gen. Pleasant, well-nourished, in no distress ENT - no lesions, no post nasal drip Neck: No JVD, no thyromegaly, no carotid bruits Lungs: no use of accessory muscles, no dullness to percussion, clear without rales or rhonchi  Cardiovascular: Rhythm regular, heart sounds  normal, no murmurs or gallops, no peripheral edema Musculoskeletal: No deformities, no cyanosis or clubbing         Assessment & Plan:

## 2011-01-04 NOTE — Patient Instructions (Signed)
Chest xray today.

## 2011-01-05 ENCOUNTER — Telehealth: Payer: Self-pay | Admitting: Pulmonary Disease

## 2011-01-05 ENCOUNTER — Other Ambulatory Visit: Payer: Self-pay | Admitting: Pulmonary Disease

## 2011-01-05 DIAGNOSIS — R911 Solitary pulmonary nodule: Secondary | ICD-10-CM

## 2011-01-05 DIAGNOSIS — D568 Other thalassemias: Secondary | ICD-10-CM

## 2011-01-05 NOTE — Telephone Encounter (Signed)
Spoke with pt. I advised her of results again, and advised that RA is seeing pt's at this time, and asked if there was a specific question she had. She states no, just wants to speak with RA directly. She states can be reached at [phone]. Thanks

## 2011-01-05 NOTE — Telephone Encounter (Signed)
Discussed with pt , proceed with CT chest

## 2011-01-05 NOTE — Telephone Encounter (Signed)
I tried to reach her for CXR results. Pl let her know that CXR shows slight increase in area of scarring noted earlier . Radiologist recommends CT tog et better picture & I am in agreement. Pl order CT chest - no contrast -if she is OK with this.

## 2011-01-05 NOTE — Telephone Encounter (Signed)
PATIENT WAS GIVEN INFORMATION REGARDING CXR AND CT; HOWEVER, SHE WANTS TO TALK DIRECTLY TO DR Vassie Loll

## 2011-01-05 NOTE — Assessment & Plan Note (Signed)
Stable from '08 to last CT in 12/10 '09 PET +, Bx s/o granulomas, afb cx -ve.  Would favor sarcoid or other granulomatous disease. PFTs nml - hence no indication for steroids. Eye exam advised Given  growth in size of linear shadow on CXR, consider rpt CT - will discuss with pt before ordering.

## 2011-01-10 ENCOUNTER — Ambulatory Visit (INDEPENDENT_AMBULATORY_CARE_PROVIDER_SITE_OTHER)
Admission: RE | Admit: 2011-01-10 | Discharge: 2011-01-10 | Disposition: A | Discharge status: Home / Self Care | Payer: 59 | Source: Ambulatory Visit | Attending: Pulmonary Disease | Admitting: Pulmonary Disease

## 2011-01-10 DIAGNOSIS — J984 Other disorders of lung: Secondary | ICD-10-CM

## 2011-01-10 DIAGNOSIS — R911 Solitary pulmonary nodule: Secondary | ICD-10-CM

## 2011-01-12 ENCOUNTER — Telehealth: Payer: Self-pay | Admitting: Pulmonary Disease

## 2011-01-12 NOTE — Telephone Encounter (Signed)
Reviewed pt's chart- RA's documentation regarding pt's recent CT results indicate to arrange for OV to discuss results   Called and spoke with pt. Pt scheduled to see RA on Monday 7/30 at 1:30pm to discuss results.

## 2011-01-15 ENCOUNTER — Encounter: Payer: Self-pay | Admitting: Pulmonary Disease

## 2011-01-15 ENCOUNTER — Ambulatory Visit (INDEPENDENT_AMBULATORY_CARE_PROVIDER_SITE_OTHER): Payer: 59 | Admitting: Pulmonary Disease

## 2011-01-15 VITALS — BP 132/64 | HR 70 | Temp 97.9°F | Ht 65.0 in | Wt 170.0 lb

## 2011-01-15 DIAGNOSIS — J984 Other disorders of lung: Secondary | ICD-10-CM

## 2011-01-15 NOTE — Patient Instructions (Signed)
Repeat CT scan in 6 months You have a  5mm spot in your left kidney

## 2011-01-15 NOTE — Progress Notes (Signed)
  Subjective:    Patient ID: Amber Compton, female    DOB: Sep 30, 1945, 65 y.o.   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   9/08  Imaging prompted by positive PPD. CT of the chest with contrast showed a 2.2 x 2.2 cm lesion was in the anterior right upper lobe, a 2.1 x 2.5 cm opacity in the lingula, and 2.1 x 3 cm opacity in the left lung base. Other scattered nodules were also seen. Of note, these were not present in 2005 imaging studies.  ACE nml, ANA -ve, ESR 18  A CT in October,2003 showed noncalcified lymph node lateral to the aortic arch, measuring 1.8 x 1.5 cm and calcified granuloma in the right upper lobe.  Chest x-ray from 1980 showed 1 cm calcified lesion in the superior right mid lung field.  2/09 PET +, Bx s/o granulomas, afb cx -ve.  PFTs dec'11 nml lung function  She has h/o arthritis responding to prednisone (seen by Dr Kellie Simmering)  01/04/11 Developed substernal discomfort, treated for GERD with omeprazole, ENT evaln neg.  CXR showed chronic changes at the lung bases with an area of increased nodularity in the left mid lung at site of previously seen linear density  01/15/2011 Serial imaging reviewed from 2005 to now -Waxing and waning lung nodules and masses. Mildly increased prominence of the left lower lobe mass on CT 7/12. 5mm Lt kidney angiomyolipoma No fevers, night sweats, wt loss, loss of appetitie, skin rash, joint pains.   Review of Systems Patient denies significant dyspnea,cough, hemoptysis,  chest pain, palpitations, pedal edema, orthopnea, paroxysmal nocturnal dyspnea, lightheadedness, nausea, vomiting, abdominal or  leg pains      Objective:   Physical Exam Gen. Pleasant, well-nourished, in no distress ENT - no lesions, no post nasal drip Neck: No JVD, no thyromegaly, no carotid bruits Lungs: no  use of accessory muscles, no dullness to percussion, clear without rales or rhonchi  Cardiovascular: Rhythm regular, heart sounds  normal, no murmurs or gallops, no peripheral edema Musculoskeletal: No deformities, no cyanosis or clubbing         Assessment & Plan:   No problem-specific assessment & plan notes found for this encounter.

## 2011-01-15 NOTE — Assessment & Plan Note (Signed)
Waxing & waning from  '08 to last CT in 7/12 '09 PET +,LLL Bx s/o granulomas, afb cx -ve.  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 Doubt treatment necessary at this time given nml lung function. Kidney lesion may need FU in 6 mnths. Detailed discussion with pt & husband today

## 2011-03-26 ENCOUNTER — Other Ambulatory Visit (HOSPITAL_COMMUNITY): Payer: Self-pay | Admitting: Internal Medicine

## 2011-03-26 DIAGNOSIS — Z1231 Encounter for screening mammogram for malignant neoplasm of breast: Secondary | ICD-10-CM

## 2011-03-29 LAB — AFB CULTURE WITH SMEAR (NOT AT ARMC): Acid Fast Smear: NONE SEEN

## 2011-03-29 LAB — CBC
Hemoglobin: 14.4
RBC: 4.56
RDW: 13.3

## 2011-03-29 LAB — PROTIME-INR
INR: 1
Prothrombin Time: 13

## 2011-03-29 LAB — BODY FLUID CULTURE

## 2011-04-20 ENCOUNTER — Ambulatory Visit (HOSPITAL_COMMUNITY)
Admission: RE | Admit: 2011-04-20 | Discharge: 2011-04-20 | Disposition: A | Discharge status: Home / Self Care | Payer: 59 | Source: Ambulatory Visit | Attending: Internal Medicine | Admitting: Internal Medicine

## 2011-04-20 DIAGNOSIS — Z1231 Encounter for screening mammogram for malignant neoplasm of breast: Secondary | ICD-10-CM

## 2011-07-23 ENCOUNTER — Telehealth: Payer: Self-pay | Admitting: Pulmonary Disease

## 2011-07-23 NOTE — Telephone Encounter (Signed)
Per last OV pt to have follow up CT chest and ROV. Dr. Vassie Loll please advise with or without. Please advise, thanks.  Pt states no longer has [employer] and now has [employer] for primary and [employer] for [employer].

## 2011-07-23 NOTE — Telephone Encounter (Signed)
CT chest with contrast;

## 2011-07-25 ENCOUNTER — Telehealth: Payer: Self-pay | Admitting: Pulmonary Disease

## 2011-07-25 DIAGNOSIS — J984 Other disorders of lung: Secondary | ICD-10-CM

## 2011-07-25 NOTE — Telephone Encounter (Signed)
Previous documentation was on wrong patient please disregard.

## 2011-07-25 NOTE — Telephone Encounter (Deleted)
Patient is calling to get CT Scheduled.  She is wanting this done soon.

## 2011-07-25 NOTE — Telephone Encounter (Signed)
Spoke with pt and apologized for the delay. Orders have been placed for CT  Chest with contrast and BMET.

## 2011-07-31 ENCOUNTER — Other Ambulatory Visit: Payer: 59

## 2011-08-13 ENCOUNTER — Ambulatory Visit: Payer: 59 | Admitting: Pulmonary Disease

## 2011-08-13 ENCOUNTER — Other Ambulatory Visit: Payer: 59

## 2011-09-10 ENCOUNTER — Other Ambulatory Visit (INDEPENDENT_AMBULATORY_CARE_PROVIDER_SITE_OTHER): Payer: Medicare Other

## 2011-09-10 DIAGNOSIS — J984 Other disorders of lung: Secondary | ICD-10-CM

## 2011-09-10 LAB — BASIC METABOLIC PANEL
BUN: 15 mg/dL (ref 6–23)
CO2: 30 mEq/L (ref 19–32)
Glucose, Bld: 97 mg/dL (ref 70–99)
Potassium: 4.2 mEq/L (ref 3.5–5.1)
Sodium: 138 mEq/L (ref 135–145)

## 2011-09-13 ENCOUNTER — Ambulatory Visit (INDEPENDENT_AMBULATORY_CARE_PROVIDER_SITE_OTHER)
Admission: RE | Admit: 2011-09-13 | Discharge: 2011-09-13 | Disposition: A | Discharge status: Home / Self Care | Payer: Medicare Other | Source: Ambulatory Visit | Attending: Pulmonary Disease | Admitting: Pulmonary Disease

## 2011-09-13 ENCOUNTER — Encounter: Payer: Self-pay | Admitting: Pulmonary Disease

## 2011-09-13 ENCOUNTER — Ambulatory Visit (INDEPENDENT_AMBULATORY_CARE_PROVIDER_SITE_OTHER): Payer: Medicare Other | Admitting: Pulmonary Disease

## 2011-09-13 VITALS — BP 126/70 | HR 78 | Temp 98.2°F | Ht 65.0 in | Wt 171.2 lb

## 2011-09-13 DIAGNOSIS — D1771 Benign lipomatous neoplasm of kidney: Secondary | ICD-10-CM

## 2011-09-13 DIAGNOSIS — J984 Other disorders of lung: Secondary | ICD-10-CM

## 2011-09-13 DIAGNOSIS — D3000 Benign neoplasm of unspecified kidney: Secondary | ICD-10-CM

## 2011-09-13 MED ORDER — IOHEXOL 300 MG/ML  SOLN
80.0000 mL | Freq: Once | INTRAMUSCULAR | Status: AC | PRN
  Administered 2011-09-13: 80 mL via INTRAVENOUS

## 2011-09-13 NOTE — Progress Notes (Signed)
  Subjective:    Patient ID: Amber Compton, female    DOB: 1946-06-18, 66 y.o.   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  9/08 Imaging prompted by positive PPD. CT of the chest with contrast showed a 2.2 x 2.2 cm lesion was in the anterior right upper lobe, a 2.1 x 2.5 cm opacity in the lingula, and 2.1 x 3 cm opacity in the left lung base. Other scattered nodules were also seen. Of note, these were not present in 2005 imaging studies.  ACE nml, ANA -ve, ESR 18  A CT in October,2003 showed noncalcified lymph node lateral to the aortic arch, measuring 1.8 x 1.5 cm and calcified granuloma in the right upper lobe.  Chest x-ray from 1980 showed 1 cm calcified lesion in the superior right mid lung field.  2/09 PET +, Bx s/o granulomas, afb cx -ve.  PFTs dec'11 nml lung function  She has h/o arthritis responding to prednisone (seen by Dr Amber Compton)  01/04/11 -Developed substernal discomfort, treated for GERD with omeprazole, ENT evaln neg.   01/15/2011  Serial imaging reviewed from 2005 to now -Waxing and waning lung nodules and masses. Mildly increased prominence of the left lower lobe mass on CT 7/12. 5mm Lt kidney angiomyolipoma   09/13/2011 CT 3/13 Pulmonary nodules and masses  are unchanged to decreased in size. Reviewed with radiologist - 5mm kidney lesion unchanged  No fevers, night sweats, wt loss, loss of appetitie, skin rash, joint pains.     Review of Systems Patient denies significant dyspnea,cough, hemoptysis,  chest pain, palpitations, pedal edema, orthopnea, paroxysmal nocturnal dyspnea, lightheadedness, nausea, vomiting, abdominal or  leg pains      Objective:   Physical Exam  Gen. Pleasant, well-nourished, in no distress ENT - no lesions, no post nasal drip Neck: No JVD, no thyromegaly, no carotid  bruits Lungs: no use of accessory muscles, no dullness to percussion, clear without rales or rhonchi  Cardiovascular: Rhythm regular, heart sounds  normal, no murmurs or gallops, no peripheral edema Musculoskeletal: No deformities, no cyanosis or clubbing        Assessment & Plan:

## 2011-09-13 NOTE — Patient Instructions (Signed)
FU with CXR in 6 months

## 2011-09-15 ENCOUNTER — Telehealth: Payer: Self-pay | Admitting: Pulmonary Disease

## 2011-09-15 DIAGNOSIS — D1771 Benign lipomatous neoplasm of kidney: Secondary | ICD-10-CM | POA: Insufficient documentation

## 2011-09-15 NOTE — Assessment & Plan Note (Signed)
Waxing & waning from  '08 to last CT in 3/13 '09 PET +,LLL Bx s/o granulomas, afb cx -ve.  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

## 2011-09-15 NOTE — Telephone Encounter (Signed)
I Reviewed CT  with radiologist - 5mm kidney lesion still present, trace of fat confirms diagnosis, benign lesion, does not need FU

## 2011-09-15 NOTE — Assessment & Plan Note (Signed)
Reviewed with radiologist 3/13- trace of fat confirms diagnosis, benign lesion, does not need FU

## 2011-09-18 NOTE — Telephone Encounter (Signed)
I spoke with patient about results and she verbalized understanding and had no questions 

## 2011-09-28 ENCOUNTER — Telehealth: Payer: Self-pay | Admitting: Pulmonary Disease

## 2011-09-28 NOTE — Telephone Encounter (Signed)
She can - unknown significance

## 2011-09-28 NOTE — Telephone Encounter (Signed)
This has been discussed on OV - I dont understand -pl see prior notees

## 2011-09-28 NOTE — Telephone Encounter (Signed)
Dr. Vassie Loll, please advise ct chest results from 09/13/11 thanks!

## 2011-09-28 NOTE — Telephone Encounter (Signed)
Spoke with pt and she states after reviewing her ct report, she has questions about fatty infiltration of the liver that was noted. Wants to know if she needs to f/u with her PCP regarding this. Please advise, thanks!

## 2011-10-01 NOTE — Telephone Encounter (Signed)
I spoke with the pt and advised of recs below. Carron Curie, CMA

## 2012-02-06 ENCOUNTER — Telehealth: Payer: Self-pay | Admitting: Pulmonary Disease

## 2012-02-06 NOTE — Telephone Encounter (Signed)
Called pt to schd a follow up apt in October. Pt does not want apt at this time. Pt will call back to schd a follow up apt in November. Pt request for [place] not to call her again.

## 2012-04-22 ENCOUNTER — Ambulatory Visit (INDEPENDENT_AMBULATORY_CARE_PROVIDER_SITE_OTHER): Payer: Medicare Other | Admitting: Pulmonary Disease

## 2012-04-22 ENCOUNTER — Ambulatory Visit (INDEPENDENT_AMBULATORY_CARE_PROVIDER_SITE_OTHER)
Admission: RE | Admit: 2012-04-22 | Discharge: 2012-04-22 | Disposition: A | Discharge status: Home / Self Care | Payer: Medicare Other | Source: Ambulatory Visit | Attending: Pulmonary Disease | Admitting: Pulmonary Disease

## 2012-04-22 ENCOUNTER — Encounter: Payer: Self-pay | Admitting: Pulmonary Disease

## 2012-04-22 VITALS — BP 128/80 | HR 77 | Temp 98.7°F | Ht 65.0 in | Wt 171.2 lb

## 2012-04-22 DIAGNOSIS — J984 Other disorders of lung: Secondary | ICD-10-CM

## 2012-04-22 DIAGNOSIS — R930 Abnormal findings on diagnostic imaging of skull and head, not elsewhere classified: Secondary | ICD-10-CM

## 2012-04-22 NOTE — Progress Notes (Signed)
  Subjective:    Patient ID: Amber Compton, female    DOB: 05/22/1946, 66 y.o.   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  9/08 Imaging prompted by positive PPD. CT of the chest with contrast showed a 2.2 x 2.2 cm lesion was in the anterior right upper lobe, a 2.1 x 2.5 cm opacity in the lingula, and 2.1 x 3 cm opacity in the left lung base. Other scattered nodules were also seen. Of note, these were not present in 2005 imaging studies.  ACE nml, ANA -ve, ESR 18  A CT in October,2003 showed noncalcified lymph node lateral to the aortic arch, measuring 1.8 x 1.5 cm and calcified granuloma in the right upper lobe.  Chest x-ray from 1980 showed 1 cm calcified lesion in the superior right mid lung field.  2/09 PET +, Bx s/o granulomas, afb cx -ve.  PFTs dec'11 nml lung function  She has h/o arthritis responding to prednisone (seen by Dr Kellie Simmering)   01/04/11 -Developed substernal discomfort, treated for GERD with omeprazole, ENT evaln neg.   01/15/2011  Serial imaging reviewed from 2005 to now -Waxing and waning lung nodules and masses. Mildly increased prominence of the left lower lobe mass on CT 7/12. 5mm Lt kidney angiomyolipoma   CT 3/13 Pulmonary nodules and masses are unchanged to decreased in size.  Reviewed with radiologist - 5mm kidney lesion unchanged   04/22/2012 Sinus infection last week, otherwise no new complaints CXR - stable nodules compared to CT 3/13 No fevers, night sweats, wt loss, loss of appetitie, skin rash, joint pains.      Review of Systems Patient denies significant dyspnea,cough, hemoptysis,  chest pain, palpitations, pedal edema, orthopnea, paroxysmal nocturnal dyspnea, lightheadedness, nausea, vomiting, abdominal or  leg pains      Objective:   Physical Exam  Gen. Pleasant, well-nourished, in  no distress ENT - no lesions, no post nasal drip Neck: No JVD, no thyromegaly, no carotid bruits Lungs: no use of accessory muscles, no dullness to percussion, clear without rales or rhonchi  Cardiovascular: Rhythm regular, heart sounds  normal, no murmurs or gallops, no peripheral edema Musculoskeletal: No deformities, no cyanosis or clubbing        Assessment & Plan:

## 2012-04-22 NOTE — Patient Instructions (Addendum)
Nodules are stable in size

## 2012-04-22 NOTE — Assessment & Plan Note (Signed)
Stable nodules over 5 y Will follow with CXR from here on Discussed in detail - she will call for persistent cough, fever or unexplained symptoms

## 2012-07-04 ENCOUNTER — Other Ambulatory Visit (HOSPITAL_COMMUNITY): Payer: Self-pay | Admitting: Internal Medicine

## 2012-07-04 DIAGNOSIS — Z1231 Encounter for screening mammogram for malignant neoplasm of breast: Secondary | ICD-10-CM

## 2012-07-09 ENCOUNTER — Other Ambulatory Visit (HOSPITAL_COMMUNITY): Payer: Self-pay | Admitting: Internal Medicine

## 2012-07-09 DIAGNOSIS — M810 Age-related osteoporosis without current pathological fracture: Secondary | ICD-10-CM

## 2012-07-09 DIAGNOSIS — Z13820 Encounter for screening for osteoporosis: Secondary | ICD-10-CM

## 2012-07-14 ENCOUNTER — Ambulatory Visit (HOSPITAL_COMMUNITY)
Admission: RE | Admit: 2012-07-14 | Discharge: 2012-07-14 | Disposition: A | Discharge status: Home / Self Care | Payer: Medicare Other | Source: Ambulatory Visit | Attending: Internal Medicine | Admitting: Internal Medicine

## 2012-07-14 DIAGNOSIS — Z13820 Encounter for screening for osteoporosis: Secondary | ICD-10-CM | POA: Insufficient documentation

## 2012-07-14 DIAGNOSIS — Z780 Asymptomatic menopausal state: Secondary | ICD-10-CM | POA: Insufficient documentation

## 2012-07-14 DIAGNOSIS — Z1231 Encounter for screening mammogram for malignant neoplasm of breast: Secondary | ICD-10-CM | POA: Insufficient documentation

## 2012-07-14 DIAGNOSIS — M810 Age-related osteoporosis without current pathological fracture: Secondary | ICD-10-CM

## 2012-10-07 ENCOUNTER — Other Ambulatory Visit: Payer: Self-pay | Admitting: Family Medicine

## 2012-10-07 DIAGNOSIS — M545 Low back pain, unspecified: Secondary | ICD-10-CM

## 2012-10-11 ENCOUNTER — Ambulatory Visit
Admission: RE | Admit: 2012-10-11 | Discharge: 2012-10-11 | Disposition: A | Discharge status: Home / Self Care | Payer: Medicare Other | Source: Ambulatory Visit | Attending: Family Medicine | Admitting: Family Medicine

## 2012-10-11 DIAGNOSIS — M545 Low back pain, unspecified: Secondary | ICD-10-CM

## 2013-02-04 ENCOUNTER — Other Ambulatory Visit: Payer: Self-pay | Admitting: Specialist

## 2013-02-04 DIAGNOSIS — M533 Sacrococcygeal disorders, not elsewhere classified: Secondary | ICD-10-CM

## 2013-02-08 ENCOUNTER — Ambulatory Visit
Admission: RE | Admit: 2013-02-08 | Discharge: 2013-02-08 | Disposition: A | Discharge status: Home / Self Care | Payer: Medicare Other | Source: Ambulatory Visit | Attending: Specialist | Admitting: Specialist

## 2013-02-08 DIAGNOSIS — M533 Sacrococcygeal disorders, not elsewhere classified: Secondary | ICD-10-CM

## 2013-02-11 ENCOUNTER — Other Ambulatory Visit: Payer: Medicare Other

## 2013-05-12 ENCOUNTER — Other Ambulatory Visit: Payer: Self-pay | Admitting: Pain Medicine

## 2013-05-12 DIAGNOSIS — M25512 Pain in left shoulder: Secondary | ICD-10-CM

## 2013-05-20 ENCOUNTER — Ambulatory Visit (INDEPENDENT_AMBULATORY_CARE_PROVIDER_SITE_OTHER): Payer: Medicare Other | Admitting: Pulmonary Disease

## 2013-05-20 ENCOUNTER — Encounter: Payer: Self-pay | Admitting: Pulmonary Disease

## 2013-05-20 ENCOUNTER — Ambulatory Visit (INDEPENDENT_AMBULATORY_CARE_PROVIDER_SITE_OTHER)
Admission: RE | Admit: 2013-05-20 | Discharge: 2013-05-20 | Disposition: A | Discharge status: Home / Self Care | Payer: Medicare Other | Source: Ambulatory Visit | Attending: Pulmonary Disease | Admitting: Pulmonary Disease

## 2013-05-20 ENCOUNTER — Encounter (INDEPENDENT_AMBULATORY_CARE_PROVIDER_SITE_OTHER): Payer: Self-pay

## 2013-05-20 VITALS — BP 138/76 | HR 70 | Temp 98.1°F | Ht 65.0 in | Wt 174.0 lb

## 2013-05-20 DIAGNOSIS — J984 Other disorders of lung: Secondary | ICD-10-CM

## 2013-05-20 NOTE — Patient Instructions (Signed)
CXR appears stable Call us as needed

## 2013-05-20 NOTE — Progress Notes (Signed)
   Subjective:    Patient ID: Amber Compton, female    DOB: 05-Aug-1945, 67 y.o.   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  9/08 Imaging prompted by positive PPD. CT of the chest with contrast showed a 2.2 x 2.2 cm lesion was in the anterior right upper lobe, a 2.1 x 2.5 cm opacity in the lingula, and 2.1 x 3 cm opacity in the left lung base. Other scattered nodules were also seen. Of note, these were not present in 2005 imaging studies.  ACE nml, ANA -ve, ESR 18  A CT in October,2003 showed noncalcified lymph node lateral to the aortic arch, measuring 1.8 x 1.5 cm and calcified granuloma in the right upper lobe.  Chest x-ray from 1980 showed 1 cm calcified lesion in the superior right mid lung field.  2/09 PET +, Bx s/o granulomas, afb cx -ve.  PFTs dec'11 nml lung function  She has h/o arthritis responding to prednisone (seen by Dr Kellie Simmering)  01/04/11 -Developed substernal discomfort, treated for GERD with omeprazole, ENT evaln neg.  01/15/2011  Serial imaging reviewed from 2005 to now -Waxing and waning lung nodules and masses. Mildly increased prominence of the left lower lobe mass on CT 7/12. 5mm Lt kidney angiomyolipoma  CT 3/13 Pulmonary nodules and masses are unchanged to decreased in size.  Reviewed with radiologist - 5mm kidney lesion unchanged    05/20/13   No fevers, night sweats, wt loss, loss of appetitie, skin rash, joint pains.  CXR -Stable left lung densities are noted compared to prior exam most  consistent with scarring. Stable right perihilar scarring      Review of Systems neg for any significant sore throat, dysphagia, itching, sneezing, nasal congestion or excess/ purulent secretions, fever, chills, sweats, unintended wt loss, pleuritic or exertional cp, hempoptysis, orthopnea pnd or change in  chronic leg swelling. Also denies presyncope, palpitations, heartburn, abdominal pain, nausea, vomiting, diarrhea or change in bowel or urinary habits, dysuria,hematuria, rash, arthralgias, visual complaints, headache, numbness weakness or ataxia.     Objective:   Physical Exam  Gen. Pleasant, well-nourished, in no distress ENT - no lesions, no post nasal drip Neck: No JVD, no thyromegaly, no carotid bruits Lungs: no use of accessory muscles, no dullness to percussion, clear without rales or rhonchi  Cardiovascular: Rhythm regular, heart sounds  normal, no murmurs or gallops, no peripheral edema Musculoskeletal: No deformities, no cyanosis or clubbing        Assessment & Plan:

## 2013-05-21 NOTE — Assessment & Plan Note (Signed)
These have now been stable since 2012 No more Fu required She can FU with PCP - CXR only if symptoms

## 2013-05-24 ENCOUNTER — Ambulatory Visit
Admission: RE | Admit: 2013-05-24 | Discharge: 2013-05-24 | Disposition: A | Discharge status: Home / Self Care | Payer: Medicare Other | Source: Ambulatory Visit | Attending: Pain Medicine | Admitting: Pain Medicine

## 2013-05-24 DIAGNOSIS — M25512 Pain in left shoulder: Secondary | ICD-10-CM

## 2014-02-04 ENCOUNTER — Other Ambulatory Visit: Payer: Self-pay

## 2014-02-04 ENCOUNTER — Other Ambulatory Visit (HOSPITAL_COMMUNITY): Payer: Self-pay | Admitting: Family Medicine

## 2014-02-04 DIAGNOSIS — Z1231 Encounter for screening mammogram for malignant neoplasm of breast: Secondary | ICD-10-CM

## 2014-02-19 ENCOUNTER — Ambulatory Visit: Payer: Medicare Other

## 2014-02-26 ENCOUNTER — Ambulatory Visit
Admission: RE | Admit: 2014-02-26 | Discharge: 2014-02-26 | Disposition: A | Discharge status: Home / Self Care | Payer: Medicare Other | Source: Ambulatory Visit

## 2014-02-26 DIAGNOSIS — Z1231 Encounter for screening mammogram for malignant neoplasm of breast: Secondary | ICD-10-CM

## 2014-12-13 ENCOUNTER — Other Ambulatory Visit: Payer: Self-pay

## 2015-06-02 ENCOUNTER — Other Ambulatory Visit: Payer: Self-pay

## 2015-06-02 DIAGNOSIS — Z1231 Encounter for screening mammogram for malignant neoplasm of breast: Secondary | ICD-10-CM

## 2015-07-08 ENCOUNTER — Ambulatory Visit
Admission: RE | Admit: 2015-07-08 | Discharge: 2015-07-08 | Disposition: A | Discharge status: Home / Self Care | Payer: Medicare Other | Source: Ambulatory Visit

## 2015-07-08 DIAGNOSIS — Z1231 Encounter for screening mammogram for malignant neoplasm of breast: Secondary | ICD-10-CM

## 2015-08-03 ENCOUNTER — Other Ambulatory Visit: Payer: Self-pay | Admitting: Family Medicine

## 2015-08-03 DIAGNOSIS — H93A2 Pulsatile tinnitus, left ear: Secondary | ICD-10-CM

## 2015-08-11 ENCOUNTER — Ambulatory Visit
Admission: RE | Admit: 2015-08-11 | Discharge: 2015-08-11 | Disposition: A | Discharge status: Home / Self Care | Payer: Medicare Other | Source: Ambulatory Visit | Attending: Family Medicine | Admitting: Family Medicine

## 2015-08-11 DIAGNOSIS — H93A2 Pulsatile tinnitus, left ear: Secondary | ICD-10-CM

## 2015-08-12 ENCOUNTER — Other Ambulatory Visit: Payer: Medicare Other

## 2015-08-13 ENCOUNTER — Ambulatory Visit
Admission: RE | Admit: 2015-08-13 | Discharge: 2015-08-13 | Disposition: A | Discharge status: Home / Self Care | Payer: Medicare Other | Source: Ambulatory Visit | Attending: Family Medicine | Admitting: Family Medicine

## 2015-08-13 DIAGNOSIS — H93A2 Pulsatile tinnitus, left ear: Secondary | ICD-10-CM

## 2015-08-13 MED ORDER — GADOBENATE DIMEGLUMINE 529 MG/ML IV SOLN
16.0000 mL | Freq: Once | INTRAVENOUS | Status: AC | PRN
  Administered 2015-08-13: 16 mL via INTRAVENOUS

## 2016-08-22 ENCOUNTER — Other Ambulatory Visit: Payer: Self-pay | Admitting: Internal Medicine

## 2016-08-22 DIAGNOSIS — Z1231 Encounter for screening mammogram for malignant neoplasm of breast: Secondary | ICD-10-CM

## 2016-08-24 ENCOUNTER — Ambulatory Visit
Admission: RE | Admit: 2016-08-24 | Discharge: 2016-08-24 | Disposition: A | Discharge status: Home / Self Care | Payer: Medicare Other | Source: Ambulatory Visit | Attending: Internal Medicine | Admitting: Internal Medicine

## 2016-08-24 DIAGNOSIS — Z1231 Encounter for screening mammogram for malignant neoplasm of breast: Secondary | ICD-10-CM

## 2016-08-28 ENCOUNTER — Other Ambulatory Visit: Payer: Self-pay | Admitting: Internal Medicine

## 2016-08-28 DIAGNOSIS — R928 Other abnormal and inconclusive findings on diagnostic imaging of breast: Secondary | ICD-10-CM

## 2016-08-31 ENCOUNTER — Ambulatory Visit
Admission: RE | Admit: 2016-08-31 | Discharge: 2016-08-31 | Disposition: A | Discharge status: Home / Self Care | Payer: Medicare Other | Source: Ambulatory Visit | Attending: Internal Medicine | Admitting: Internal Medicine

## 2016-08-31 DIAGNOSIS — R928 Other abnormal and inconclusive findings on diagnostic imaging of breast: Secondary | ICD-10-CM

## 2016-11-21 ENCOUNTER — Telehealth: Payer: Self-pay | Admitting: *Deleted

## 2016-11-21 NOTE — Telephone Encounter (Signed)
Pt informed, transferred to front desk 

## 2016-11-21 NOTE — Telephone Encounter (Signed)
(  paper chart on your desk) Pt called wanting to know if she should be scheduled for repeat pap due to insufficient cervical cells? Pt saw you on 07/20/15 for repeat pap, was insure if she needed to come back again. I explained to patient her last annual exam was in 05/19/2015 and she should schedule this but patient is worried about medicare not paying but every 2 years. Could you clarify if patient only needs repeat pap, and if so she asked if Rx for premarin cream should be sent to pharmacy.  Please advise

## 2016-11-21 NOTE — Telephone Encounter (Signed)
Patient needs to schedule Annual/Gyn exam with me now.  Will not do a Pap this year, but needs Breast/Pelvic exam and Medicare will cover.  Last pap 07/2015 negative, satisfactory, with TZ cells present.

## 2018-01-07 ENCOUNTER — Other Ambulatory Visit: Payer: Self-pay | Admitting: Internal Medicine

## 2018-01-07 DIAGNOSIS — Z1231 Encounter for screening mammogram for malignant neoplasm of breast: Secondary | ICD-10-CM

## 2018-01-28 ENCOUNTER — Ambulatory Visit
Admission: RE | Admit: 2018-01-28 | Discharge: 2018-01-28 | Disposition: A | Discharge status: Home / Self Care | Payer: Medicare Other | Source: Ambulatory Visit | Attending: Internal Medicine | Admitting: Internal Medicine

## 2018-01-28 DIAGNOSIS — Z1231 Encounter for screening mammogram for malignant neoplasm of breast: Secondary | ICD-10-CM

## 2018-01-29 ENCOUNTER — Other Ambulatory Visit: Payer: Self-pay | Admitting: Internal Medicine

## 2018-01-29 DIAGNOSIS — R928 Other abnormal and inconclusive findings on diagnostic imaging of breast: Secondary | ICD-10-CM

## 2018-02-03 ENCOUNTER — Other Ambulatory Visit: Payer: Medicare Other

## 2018-02-06 ENCOUNTER — Ambulatory Visit
Admission: RE | Admit: 2018-02-06 | Discharge: 2018-02-06 | Disposition: A | Discharge status: Home / Self Care | Payer: Medicare Other | Source: Ambulatory Visit | Attending: Internal Medicine | Admitting: Internal Medicine

## 2018-02-06 ENCOUNTER — Ambulatory Visit: Payer: Medicare Other

## 2018-02-06 DIAGNOSIS — R928 Other abnormal and inconclusive findings on diagnostic imaging of breast: Secondary | ICD-10-CM

## 2018-02-14 ENCOUNTER — Encounter: Payer: Medicare Other | Admitting: Obstetrics & Gynecology

## 2018-04-10 ENCOUNTER — Ambulatory Visit (INDEPENDENT_AMBULATORY_CARE_PROVIDER_SITE_OTHER): Payer: Medicare Other | Admitting: Obstetrics & Gynecology

## 2018-04-10 ENCOUNTER — Telehealth: Payer: Self-pay | Admitting: *Deleted

## 2018-04-10 ENCOUNTER — Encounter: Payer: Self-pay | Admitting: Obstetrics & Gynecology

## 2018-04-10 VITALS — BP 140/78 | Ht 64.5 in | Wt 168.0 lb

## 2018-04-10 DIAGNOSIS — N8189 Other female genital prolapse: Secondary | ICD-10-CM

## 2018-04-10 DIAGNOSIS — Z01419 Encounter for gynecological examination (general) (routine) without abnormal findings: Secondary | ICD-10-CM | POA: Diagnosis not present

## 2018-04-10 DIAGNOSIS — Z780 Asymptomatic menopausal state: Secondary | ICD-10-CM | POA: Diagnosis not present

## 2018-04-10 DIAGNOSIS — M85832 Other specified disorders of bone density and structure, left forearm: Secondary | ICD-10-CM | POA: Diagnosis not present

## 2018-04-10 DIAGNOSIS — R159 Full incontinence of feces: Secondary | ICD-10-CM

## 2018-04-10 NOTE — Progress Notes (Signed)
Amber Compton [birth year] [id number]   History:    72 y.o. G2P2L2 Married  RP:  Established patient presenting for annual gyn exam   HPI: Menopause, well on no HRT.  No PMB.  No Pelvic pain.  Abstinence.  Urine normal.  C/O Incontinence of stools, seen by surgeon.  Breasts normal.  BMI 28.39.  Health labs with Fam MD.  Past medical history,surgical history, family history and social history were all reviewed and documented in the EPIC chart.  Gynecologic History No LMP recorded. Patient is postmenopausal. Contraception: abstinence and post menopausal status Last Pap: 07/2015. Results were: Negative Last mammogram: 01/2018. Results were: Benign Bone Density: 01/2018 Osteopenia at 1/3 Forearm T-Score -2.4. Colonoscopy: 2014  Obstetric History OB History  Gravida Para Term Preterm AB Living  2 2       2   SAB TAB Ectopic Multiple Live Births               # Outcome Date GA Lbr Len/2nd Weight Sex Delivery Anes PTL Lv  2 Para           1 Para              ROS: A ROS was performed and pertinent positives and negatives are included in the history.  GENERAL: No fevers or chills. HEENT: No change in vision, no earache, sore throat or sinus congestion. NECK: No pain or stiffness. CARDIOVASCULAR: No chest pain or pressure. No palpitations. PULMONARY: No shortness of breath, cough or wheeze. GASTROINTESTINAL: No abdominal pain, nausea, vomiting or diarrhea, melena or bright red blood per rectum. GENITOURINARY: No urinary frequency, urgency, hesitancy or dysuria. MUSCULOSKELETAL: No joint or muscle pain, no back pain, no recent trauma. DERMATOLOGIC: No rash, no itching, no lesions. ENDOCRINE: No polyuria, polydipsia, no heat or cold intolerance. No recent change in weight. HEMATOLOGICAL: No anemia or easy bruising or bleeding. NEUROLOGIC: No headache, seizures, numbness, tingling or weakness. PSYCHIATRIC: No depression, no loss of interest in normal activity or change in sleep pattern.      Exam:   BP 140/78   Ht 5' 4.5" (1.638 m)   Wt 168 lb (76.2 kg)   BMI 28.39 kg/m   Body mass index is 28.39 kg/m.  General appearance : Well developed well nourished female. No acute distress HEENT: Eyes: no retinal hemorrhage or exudates,  Neck supple, trachea midline, no carotid bruits, no thyroidmegaly Lungs: Clear to auscultation, no rhonchi or wheezes, or rib retractions  Heart: Regular rate and rhythm, no murmurs or gallops Breast:Examined in sitting and supine position were symmetrical in appearance, no palpable masses or tenderness,  no skin retraction, no nipple inversion, no nipple discharge, no skin discoloration, no axillary or supraclavicular lymphadenopathy Abdomen: no palpable masses or tenderness, no rebound or guarding Extremities: no edema or skin discoloration or tenderness  Pelvic: Vulva: Normal             Vagina: No gross lesions or discharge  Cervix: No gross lesions or discharge  Uterus  AV, normal size, shape and consistency, non-tender and mobile  Adnexa  Without masses or tenderness  Anus: Normal   Assessment/Plan:  72 y.o. female for annual exam   1. Well female exam with routine gynecological exam Normal gynecologic exam and menopause.  Pap test negative in 07/2015.  Breast exam normal.  Mammogram benign in August 2019.  Colonoscopy in 2014.  Health labs with family physician.  2. Postmenopausal Well on no hormone replacement therapy.  No postmenopausal  bleeding.  3. Osteopenia of left forearm Bone density in August 2019 showed osteopenia at the forearm.  Vitamin D supplements, calcium intake of 1.5 g/day and regular weightbearing physical activity recommended.  We will repeat the bone density in August 2021.  4. Incontinence of feces, unspecified fecal incontinence type Refer to PT to reinforce the pelvic floor including the anal sphincter.  Follow-up with general surgeon.  Counseling on above issues and coordination of care more than 50% for  10 minutes.  Princess Bruins MD, 10:22 AM 04/10/2018

## 2018-04-10 NOTE — Telephone Encounter (Signed)
-----   Message from Princess Bruins, MD sent at 04/10/2018 10:44 AM EDT ----- Regarding: Refer to Physical Therapy Incontinence of stools.  PT for Pelvic floor reinforcement.

## 2018-04-10 NOTE — Telephone Encounter (Signed)
Referral placed at [facility] location they will call to schedule.

## 2018-04-13 ENCOUNTER — Encounter: Payer: Self-pay | Admitting: Obstetrics & Gynecology

## 2018-04-13 NOTE — Patient Instructions (Signed)
1. Well female exam with routine gynecological exam Normal gynecologic exam and menopause.  Pap test negative in 07/2015.  Breast exam normal.  Mammogram benign in August 2019.  Colonoscopy in 2014.  Health labs with family physician.  2. Postmenopausal Well on no hormone replacement therapy.  No postmenopausal bleeding.  3. Osteopenia of left forearm Bone density in August 2019 showed osteopenia at the forearm.  Vitamin D supplements, calcium intake of 1.5 g/day and regular weightbearing physical activity recommended.  We will repeat the bone density in August 2021.  4. Incontinence of feces, unspecified fecal incontinence type Refer to PT to reinforce the pelvic floor including the anal sphincter.  Follow-up with general surgeon.  Thessaly, it was a pleasure seeing you today!

## 2018-04-14 NOTE — Telephone Encounter (Signed)
[facility] leave message for patient to call on 04/10/18

## 2018-06-21 ENCOUNTER — Emergency Department (HOSPITAL_BASED_OUTPATIENT_CLINIC_OR_DEPARTMENT_OTHER): Payer: Medicare Other

## 2018-06-21 ENCOUNTER — Other Ambulatory Visit: Payer: Self-pay

## 2018-06-21 ENCOUNTER — Emergency Department (HOSPITAL_BASED_OUTPATIENT_CLINIC_OR_DEPARTMENT_OTHER)
Admission: EM | Admit: 2018-06-21 | Discharge: 2018-06-21 | Disposition: A | Discharge status: Home / Self Care | Payer: Medicare Other | Attending: Emergency Medicine | Admitting: Emergency Medicine

## 2018-06-21 ENCOUNTER — Encounter (HOSPITAL_BASED_OUTPATIENT_CLINIC_OR_DEPARTMENT_OTHER): Payer: Self-pay | Admitting: Emergency Medicine

## 2018-06-21 DIAGNOSIS — Z91040 Latex allergy status: Secondary | ICD-10-CM | POA: Diagnosis not present

## 2018-06-21 DIAGNOSIS — Z79899 Other long term (current) drug therapy: Secondary | ICD-10-CM | POA: Insufficient documentation

## 2018-06-21 DIAGNOSIS — I10 Essential (primary) hypertension: Secondary | ICD-10-CM | POA: Diagnosis not present

## 2018-06-21 DIAGNOSIS — M62838 Other muscle spasm: Secondary | ICD-10-CM | POA: Diagnosis not present

## 2018-06-21 DIAGNOSIS — Z87891 Personal history of nicotine dependence: Secondary | ICD-10-CM | POA: Insufficient documentation

## 2018-06-21 DIAGNOSIS — M542 Cervicalgia: Secondary | ICD-10-CM | POA: Diagnosis present

## 2018-06-21 LAB — BASIC METABOLIC PANEL
Anion gap: 7 (ref 5–15)
BUN: 10 mg/dL (ref 8–23)
CO2: 27 mmol/L (ref 22–32)
Calcium: 9.8 mg/dL (ref 8.9–10.3)
Chloride: 105 mmol/L (ref 98–111)
Creatinine, Ser: 0.91 mg/dL (ref 0.44–1.00)
GFR calc non Af Amer: >60 mL/min (ref >60)
Glucose, Bld: 99 mg/dL (ref 70–99)
Potassium: 4.9 mmol/L (ref 3.5–5.1)
SODIUM: 139 mmol/L (ref 135–145)

## 2018-06-21 LAB — CBC WITH DIFFERENTIAL/PLATELET
Abs Immature Granulocytes: 0.03 10*3/uL (ref 0.00–0.07)
Basophils Absolute: 0 10*3/uL (ref 0.0–0.1)
Basophils Relative: 0 %
Eosinophils Absolute: 0.2 10*3/uL (ref 0.0–0.5)
Eosinophils Relative: 3 %
HEMATOCRIT: 43.3 % (ref 36.0–46.0)
Hemoglobin: 13.6 g/dL (ref 12.0–15.0)
Immature Granulocytes: 0 %
Lymphocytes Relative: 14 %
Lymphs Abs: 1.1 10*3/uL (ref 0.7–4.0)
MCH: 30 pg (ref 26.0–34.0)
MCHC: 31.4 g/dL (ref 30.0–36.0)
MCV: 95.6 fL (ref 80.0–100.0)
MONOS PCT: 8 %
Monocytes Absolute: 0.7 10*3/uL (ref 0.1–1.0)
Neutro Abs: 6.3 10*3/uL (ref 1.7–7.7)
Neutrophils Relative %: 75 %
Platelets: 219 10*3/uL (ref 150–400)
RBC: 4.53 MIL/uL (ref 3.87–5.11)
RDW: 13.5 % (ref 11.5–15.5)
WBC: 8.4 10*3/uL (ref 4.0–10.5)
nRBC: 0 % (ref 0.0–0.2)

## 2018-06-21 MED ORDER — ACETAMINOPHEN 500 MG PO TABS
1000.0000 mg | ORAL_TABLET | Freq: Once | ORAL | Status: AC
  Administered 2018-06-21: 1000 mg via ORAL
  Filled 2018-06-21: qty 2

## 2018-06-21 MED ORDER — KETOROLAC TROMETHAMINE 15 MG/ML IJ SOLN
15.0000 mg | Freq: Once | INTRAMUSCULAR | Status: AC
  Administered 2018-06-21: 15 mg via INTRAVENOUS
  Filled 2018-06-21: qty 1

## 2018-06-21 MED ORDER — DICLOFENAC SODIUM 1 % TD GEL: 4.0000 g | Freq: Four times a day (QID) | TRANSDERMAL | 0 refills | Status: AC

## 2018-06-21 MED ORDER — IOPAMIDOL (ISOVUE-370) INJECTION 76%
100.0000 mL | Freq: Once | INTRAVENOUS | Status: AC | PRN
  Administered 2018-06-21: 100 mL via INTRAVENOUS

## 2018-06-21 MED ORDER — MORPHINE SULFATE (PF) 2 MG/ML IV SOLN: 2.0000 mg | Freq: Once | INTRAVENOUS | Status: DC

## 2018-06-21 NOTE — ED Notes (Signed)
ED Provider at bedside. 

## 2018-06-21 NOTE — ED Notes (Signed)
Pt enrolled in aromatherapy pain trial 

## 2018-06-21 NOTE — ED Notes (Signed)
CT will need to wait for results of bun/creat/egfr prior to imaging with iv contrast per protocol, pt will also need 20G AC IV

## 2018-06-21 NOTE — Discharge Instructions (Signed)
Follow up with your pain management physician, call them on Monday and let them know that you have an acutely painful condition and see what they recommend for pain.

## 2018-06-21 NOTE — ED Triage Notes (Signed)
Pt here with neck pain since Thursday with it becoming progressively worse since then. Denies fever or headaches.

## 2018-06-21 NOTE — ED Provider Notes (Signed)
[facility]ERGENCY DEPARTMENT Provider Note   CSN: [id number] Arrival date & time: 06/21/18  1120     History   Chief Complaint Chief Complaint  Patient presents with  . Neck Pain    HPI Amber Compton is a 73 y.o. female.  73 yo F with a chief complaint of right-sided neck pain.  Going on for about 3 to 4 days.  Started when she turns her head to the back her car up.  Has progressively gotten worse.  She is tried Tylenol and tramadol at home with no relief.  She is also on Robaxin and has not had any improvement with it.  She denies numbness or tingling with arm denies difficulty with speech or swallowing denies unilateral numbness or weakness.  She denies fevers or chills.  Denies trauma.  The history is provided by the patient.  Neck Pain   This is a new problem. The current episode started more than 2 days ago. The problem occurs constantly. The problem has been gradually worsening. Associated with: turning her head. There has been no fever. The pain is present in the right side. The quality of the pain is described as stabbing and shooting. The pain radiates to the right scapula. The pain is at a severity of 7/10. The pain is moderate. The symptoms are aggravated by bending and twisting. Pertinent negatives include no photophobia, no chest pain, no numbness, no weight loss and no headaches. She has tried muscle relaxants, analgesics and neck support for the symptoms. The treatment provided no relief.    Past Medical History:  Diagnosis Date  . Abnormal chest x-ray   . Arthritis, climacteric, ankle/foot   . Carpal tunnel syndrome   . Essential hypertension   . Fibromyalgia   . Positive PPD   . Postmenopausal status   . Pulmonary nodule   . Routine general medical examination at a health care facility     Patient Active Problem List   Diagnosis Date Noted  . 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 PULMONARY NODULE 02/24/2007  . Nonspecific (abnormal) findings  on radiological and other examination of body structure 02/20/2007  . ABNORMAL CHEST XRAY 02/20/2007  . POSTMENOPAUSAL STATUS 02/13/2007  . ARTHRITIS, CLIMACTERIC, ANKLE/FOOT 02/13/2007  . 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 ESSENTIAL HYPERTENSION 11/26/2006  . CARPAL TUNNEL SYNDROME, LEFT 11/18/2006  . FIBROMYALGIA 11/18/2006    Past Surgical History:  Procedure Laterality Date  . CATARACT EXTRACTION    . none       OB History    Gravida  2   Para  2   Term      Preterm      AB      Living  2     SAB      TAB      Ectopic      Multiple      Live Births               Home Medications    Prior to Admission medications   Medication Sig Start Date End Date Taking? Authorizing Provider  cholecalciferol (VITAMIN D) 1000 units tablet Take 1,000 Units by mouth daily.   Yes [provider]  Coenzyme Q10 10 MG capsule Take 10 mg by mouth daily.     Yes [provider]  gabapentin (NEURONTIN) 800 MG tablet 3 times a day 05/10/13  Yes [provider]  glucosamine-chondroitin 500-400 MG tablet Take 1 tablet by mouth 3 (three) times  daily.   Yes [provider]  hyoscyamine (LEVSIN, ANASPAZ) 0.125 MG tablet Take 0.125 mg by mouth every 4 (four) hours as needed.   Yes [provider]  lisinopril-hydrochlorothiazide (PRINZIDE,ZESTORETIC) 10-12.5 MG per tablet Once daily 03/12/13  Yes [provider]  methocarbamol (ROBAXIN) 500 MG tablet Take 500 mg by mouth 4 (four) times daily.   Yes [provider]  simvastatin (ZOCOR) 20 MG tablet Once daily 04/17/13  Yes [provider]  traMADol (ULTRAM) 50 MG tablet 1 tab 2-3 times as needed per day 04/28/13  Yes [provider]  Turmeric 500 MG CAPS Take by mouth.   Yes [provider]  diclofenac sodium (VOLTAREN) 1 % GEL Apply 4 g topically 4 (four) times daily. 06/21/18   Deno Etienne, DO    Family History Family History  Problem Relation Age of  Onset  . Depression Other   . Alcohol abuse Other   . Arthritis Other   . Hypertension Other   . Lung cancer Other   . Heart disease Other   . Cancer Father   . Lung cancer Father   . Cancer Sister        colon  . Cancer Brother        lung    Social History Social History   Tobacco Use  . Smoking status: Former Smoker    Packs/day: 0.50    Years: 4.00    Pack years: 2.00    Types: Cigarettes    Last attempt to quit: 06/18/1966    Years since quitting: 52.0  . Smokeless tobacco: Never Used  Substance Use Topics  . Alcohol use: Never    Frequency: Never    Comment: occ  . Drug use: No     Allergies   Latex and Neomycin-bacitracin zn-polymyx   Review of Systems Review of Systems  Constitutional: Negative for chills, fever and weight loss.  HENT: Negative for congestion and rhinorrhea.   Eyes: Negative for photophobia, redness and visual disturbance.  Respiratory: Negative for shortness of breath and wheezing.   Cardiovascular: Negative for chest pain and palpitations.  Gastrointestinal: Negative for nausea and vomiting.  Genitourinary: Negative for dysuria and urgency.  Musculoskeletal: Positive for neck pain. Negative for arthralgias and myalgias.  Skin: Negative for pallor and wound.  Neurological: Negative for dizziness, numbness and headaches.     Physical Exam Updated Vital Signs BP 129/68 (BP Location: Right Arm)   Pulse 72   Temp 98.4 F (36.9 C) (Oral)   Resp 18   Ht 5\' 4"  (1.626 m)   Wt 74.4 kg   SpO2 100%   BMI 28.15 kg/m   Physical Exam Vitals signs and nursing note reviewed.  Constitutional:      General: She is not in acute distress.    Appearance: She is well-developed. She is not diaphoretic.  HENT:     Head: Normocephalic and atraumatic.  Eyes:     Pupils: Pupils are equal, round, and reactive to light.  Neck:     Musculoskeletal: Normal range of motion and neck supple.  Cardiovascular:     Rate and Rhythm: Normal rate and  regular rhythm.     Heart sounds: No murmur. No friction rub. No gallop.   Pulmonary:     Effort: Pulmonary effort is normal.     Breath sounds: No wheezing or rales.  Abdominal:     General: There is no distension.     Palpations: Abdomen is soft.  Tenderness: There is no abdominal tenderness.  Musculoskeletal:        General: Tenderness present.     Comments: Tenderness at the attachment of the trapezius to the occiput as well as tenderness to the trapezius at the attachment of the acromion.  Mild tenderness about the body of the trapezius.  Pulse and sensation is intact to the right upper extremity.  Patient is unable to extend her thumb.  She is able to dorsiflex the wrist.  Skin:    General: Skin is warm and dry.  Neurological:     Mental Status: She is alert and oriented to person, place, and time.  Psychiatric:        Behavior: Behavior normal.      ED Treatments / Results  Labs (all labs ordered are listed, but only abnormal results are displayed) Labs Reviewed  CBC WITH DIFFERENTIAL/PLATELET  BASIC METABOLIC PANEL    EKG None  Radiology Ct Angio Head W Or Wo Contrast  Result Date: 06/21/2018 CLINICAL DATA:  Right-sided neck pain over the last 2 days. EXAM: CT ANGIOGRAPHY HEAD AND NECK TECHNIQUE: Multidetector CT imaging of the head and neck was performed using the standard protocol during bolus administration of intravenous contrast. Multiplanar CT image reconstructions and MIPs were obtained to evaluate the vascular anatomy. Carotid stenosis measurements (when applicable) are obtained utilizing NASCET criteria, using the distal internal carotid diameter as the denominator. CONTRAST:  18mL ISOVUE-370 IOPAMIDOL (ISOVUE-370) INJECTION 76% COMPARISON:  None. FINDINGS: CT HEAD FINDINGS Brain: The brain shows a normal appearance without evidence of malformation, atrophy, old or acute small or large vessel infarction, mass lesion, hemorrhage, hydrocephalus or extra-axial  collection. Vascular: No hyperdense vessel. No evidence of atherosclerotic calcification. Skull: Normal. No traumatic finding. No focal bone lesion. Sinuses/Orbits: Sinuses are clear. Orbits appear normal. Mastoids are clear. Other: None significant CTA NECK FINDINGS Aortic arch: Mild aortic atherosclerotic calcification. Right carotid system: Common carotid artery widely patent to the bifurcation. Carotid bifurcation shows minimal soft and calcified plaque but no stenosis. Cervical ICA is normal. Left carotid system: Common carotid artery widely patent to the bifurcation. Mild soft and calcified plaque at the carotid bifurcation and ICA bulb but no stenosis. Cervical ICA appears normal. Vertebral arteries: Both vertebral artery origins are widely patent. Both vertebral arteries appear widely patent and normal through the cervical region. Detail is somewhat limited on the right because of adjacent venous reflux. Skeleton: Ordinary cervical spondylosis. Other neck: No mass or lymphadenopathy. Upper chest: Pleural and parenchymal scarring at both apices and in the upper lobes. Review of the MIP images confirms the above findings CTA HEAD FINDINGS Anterior circulation: Both internal carotid arteries are widely patent through the skull base and siphon regions. The anterior and middle cerebral vessels are normal without stenosis, aneurysm or vascular malformation. Posterior circulation: Both vertebral arteries are patent through the foramen magnum to the basilar. No basilar stenosis. Posterior circulation branch vessels are normal. Venous sinuses: Patent and normal. Anatomic variants: None significant. Delayed phase: No abnormal enhancement. Review of the MIP images confirms the above findings IMPRESSION: 1. Head CT: Normal 2. CTA NECK: Minimal atherosclerotic change at both carotid bifurcations but no stenosis or significant irregularity. No evidence of dissection. No abnormality seen to explain neck pain. 3. CTA head:  Normal Electronically Signed   By: Nelson Chimes M.D.   On: 06/21/2018 14:32   Ct Angio Neck W And/or Wo Contrast  Result Date: 06/21/2018 CLINICAL DATA:  Right-sided neck pain over the last  2 days. EXAM: CT ANGIOGRAPHY HEAD AND NECK TECHNIQUE: Multidetector CT imaging of the head and neck was performed using the standard protocol during bolus administration of intravenous contrast. Multiplanar CT image reconstructions and MIPs were obtained to evaluate the vascular anatomy. Carotid stenosis measurements (when applicable) are obtained utilizing NASCET criteria, using the distal internal carotid diameter as the denominator. CONTRAST:  135mL ISOVUE-370 IOPAMIDOL (ISOVUE-370) INJECTION 76% COMPARISON:  None. FINDINGS: CT HEAD FINDINGS Brain: The brain shows a normal appearance without evidence of malformation, atrophy, old or acute small or large vessel infarction, mass lesion, hemorrhage, hydrocephalus or extra-axial collection. Vascular: No hyperdense vessel. No evidence of atherosclerotic calcification. Skull: Normal. No traumatic finding. No focal bone lesion. Sinuses/Orbits: Sinuses are clear. Orbits appear normal. Mastoids are clear. Other: None significant CTA NECK FINDINGS Aortic arch: Mild aortic atherosclerotic calcification. Right carotid system: Common carotid artery widely patent to the bifurcation. Carotid bifurcation shows minimal soft and calcified plaque but no stenosis. Cervical ICA is normal. Left carotid system: Common carotid artery widely patent to the bifurcation. Mild soft and calcified plaque at the carotid bifurcation and ICA bulb but no stenosis. Cervical ICA appears normal. Vertebral arteries: Both vertebral artery origins are widely patent. Both vertebral arteries appear widely patent and normal through the cervical region. Detail is somewhat limited on the right because of adjacent venous reflux. Skeleton: Ordinary cervical spondylosis. Other neck: No mass or lymphadenopathy. Upper  chest: Pleural and parenchymal scarring at both apices and in the upper lobes. Review of the MIP images confirms the above findings CTA HEAD FINDINGS Anterior circulation: Both internal carotid arteries are widely patent through the skull base and siphon regions. The anterior and middle cerebral vessels are normal without stenosis, aneurysm or vascular malformation. Posterior circulation: Both vertebral arteries are patent through the foramen magnum to the basilar. No basilar stenosis. Posterior circulation branch vessels are normal. Venous sinuses: Patent and normal. Anatomic variants: None significant. Delayed phase: No abnormal enhancement. Review of the MIP images confirms the above findings IMPRESSION: 1. Head CT: Normal 2. CTA NECK: Minimal atherosclerotic change at both carotid bifurcations but no stenosis or significant irregularity. No evidence of dissection. No abnormality seen to explain neck pain. 3. CTA head: Normal Electronically Signed   By: Nelson Chimes M.D.   On: 06/21/2018 14:32   Ct Cervical Spine Wo Contrast  Result Date: 06/21/2018 CLINICAL DATA:  Right sided neck pain x 2 days, NKI, pain starts behind right ear and radiates down neck laterally and around to posterior aspect of neckMD also ordered CTA head and neck today EXAM: CT CERVICAL SPINE WITHOUT CONTRAST TECHNIQUE: Multidetector CT imaging of the cervical spine was performed without intravenous contrast. Multiplanar CT image reconstructions were also generated. COMPARISON:  None. FINDINGS: Alignment: Mild reversal the normal cervical lordosis, apex at C6. No spondylolisthesis. Skull base and vertebrae: No acute fracture. No primary bone lesion or focal pathologic process. Soft tissues and spinal canal: No prevertebral fluid or swelling. No visible canal hematoma. Disc levels: Moderate loss of disc height at C3-C4 with mild to moderate loss of disc height at C5-C6 and C6-C7. Is spondylotic disc bulging with endplate spurring at these  levels. Mild left neural foraminal narrowing at C6-C7 from uncovertebral spurring. Mild neural foraminal narrowing on the right at C3-C4 from uncovertebral spurring. No disc herniations. No other stenosis. Upper chest: No neck base masses or adenopathy. Bilateral apical pleuroparenchymal scarring Other: None IMPRESSION: 1. No fracture or acute finding.  No bone lesion. 2. Degenerative changes  as described most evident at C3-C4, C5-C6 and C6-C7. Electronically Signed   By: Lajean Manes M.D.   On: 06/21/2018 14:24    Procedures Procedures (including critical care time)  Medications Ordered in ED Medications  morphine 2 MG/ML injection 2 mg (2 mg Intravenous Refused 06/21/18 1308)  acetaminophen (TYLENOL) tablet 1,000 mg (1,000 mg Oral Given 06/21/18 1312)  ketorolac (TORADOL) 15 MG/ML injection 15 mg (15 mg Intravenous Given 06/21/18 1313)  iopamidol (ISOVUE-370) 76 % injection 100 mL (100 mLs Intravenous Contrast Given 06/21/18 1348)     Initial Impression / Assessment and Plan / ED Course  I have reviewed the triage vital signs and the nursing notes.  Pertinent labs & imaging results that were available during my care of the patient were reviewed by me and considered in my medical decision making (see chart for details).     73 yo F with a chief complaint of neck pain.  Happened when she was backing her car up a few days ago.  Continues to have pain.  Most likely this is trapezius spasm.  I will obtain a CT angiogram to evaluate for carotid artery dissection.  CT scan is negative.  Patient reassessed feeling much better.  Discharge home.  4:06 PM:  I have discussed the diagnosis/risks/treatment options with the patient and family and believe the pt to be eligible for discharge home to follow-up with PCP. We also discussed returning to the ED immediately if new or worsening sx occur. We discussed the sx which are most concerning (e.g., sudden worsening pain, fever, inability to tolerate by mouth)  that necessitate immediate return. Medications administered to the patient during their visit and any new prescriptions provided to the patient are listed below.  Medications given during this visit Medications  morphine 2 MG/ML injection 2 mg (2 mg Intravenous Refused 06/21/18 1308)  acetaminophen (TYLENOL) tablet 1,000 mg (1,000 mg Oral Given 06/21/18 1312)  ketorolac (TORADOL) 15 MG/ML injection 15 mg (15 mg Intravenous Given 06/21/18 1313)  iopamidol (ISOVUE-370) 76 % injection 100 mL (100 mLs Intravenous Contrast Given 06/21/18 1348)     The patient appears reasonably screen and/or stabilized for discharge and I doubt any other medical condition or other [employer] requiring further screening, evaluation, or treatment in the ED at this time prior to discharge.    Final Clinical Impressions(s) / ED Diagnoses   Final diagnoses:  Trapezius muscle spasm    ED Discharge Orders         Ordered    diclofenac sodium (VOLTAREN) 1 % GEL  4 times daily     06/21/18 [place], Arelys Glassco, DO 06/21/18 1606

## 2018-06-21 NOTE — ED Notes (Signed)
Patient transported to CT 

## 2019-02-17 ENCOUNTER — Other Ambulatory Visit: Payer: Self-pay | Admitting: Family Medicine

## 2019-02-17 DIAGNOSIS — Z1231 Encounter for screening mammogram for malignant neoplasm of breast: Secondary | ICD-10-CM

## 2019-04-01 ENCOUNTER — Ambulatory Visit
Admission: RE | Admit: 2019-04-01 | Discharge: 2019-04-01 | Disposition: A | Discharge status: Home / Self Care | Payer: Medicare Other | Source: Ambulatory Visit | Attending: Family Medicine | Admitting: Family Medicine

## 2019-04-01 ENCOUNTER — Other Ambulatory Visit: Payer: Self-pay

## 2019-04-01 DIAGNOSIS — Z1231 Encounter for screening mammogram for malignant neoplasm of breast: Secondary | ICD-10-CM

## 2024-03-11 NOTE — Progress Notes (Signed)
 This encounter was created in error - please disregard.
